# Patient Record
Sex: Male | Born: 1970 | Race: White | Hispanic: Yes | Marital: Single | Smoking: Never smoker
Health system: Southern US, Community
[De-identification: ages and names within clinical notes are randomized; demographics above are authoritative.]

## PROBLEM LIST (undated history)

## (undated) DIAGNOSIS — E785 Hyperlipidemia, unspecified: Secondary | ICD-10-CM

## (undated) DIAGNOSIS — I10 Essential (primary) hypertension: Secondary | ICD-10-CM

## (undated) DIAGNOSIS — I2510 Atherosclerotic heart disease of native coronary artery without angina pectoris: Secondary | ICD-10-CM

## (undated) DIAGNOSIS — I2119 ST elevation (STEMI) myocardial infarction involving other coronary artery of inferior wall: Secondary | ICD-10-CM

## (undated) DIAGNOSIS — Z9861 Coronary angioplasty status: Secondary | ICD-10-CM

---

## 2018-03-03 ENCOUNTER — Inpatient Hospital Stay (HOSPITAL_COMMUNITY)
Admission: EM | Admit: 2018-03-03 | Discharge: 2018-03-05 | DRG: 247 | Disposition: A | Discharge status: Home / Self Care | Payer: Self-pay | Attending: Cardiology | Admitting: Cardiology

## 2018-03-03 ENCOUNTER — Emergency Department (HOSPITAL_COMMUNITY): Payer: Self-pay

## 2018-03-03 ENCOUNTER — Encounter (HOSPITAL_COMMUNITY): Payer: Self-pay | Admitting: *Deleted

## 2018-03-03 ENCOUNTER — Inpatient Hospital Stay (HOSPITAL_COMMUNITY)
Admission: EM | Disposition: A | Discharge status: Home / Self Care | Payer: Self-pay | Source: Home / Self Care | Attending: Cardiology

## 2018-03-03 ENCOUNTER — Other Ambulatory Visit: Payer: Self-pay

## 2018-03-03 DIAGNOSIS — I213 ST elevation (STEMI) myocardial infarction of unspecified site: Secondary | ICD-10-CM

## 2018-03-03 DIAGNOSIS — E876 Hypokalemia: Secondary | ICD-10-CM | POA: Diagnosis present

## 2018-03-03 DIAGNOSIS — I10 Essential (primary) hypertension: Secondary | ICD-10-CM

## 2018-03-03 DIAGNOSIS — I2510 Atherosclerotic heart disease of native coronary artery without angina pectoris: Secondary | ICD-10-CM

## 2018-03-03 DIAGNOSIS — I2119 ST elevation (STEMI) myocardial infarction involving other coronary artery of inferior wall: Principal | ICD-10-CM | POA: Diagnosis present

## 2018-03-03 DIAGNOSIS — E785 Hyperlipidemia, unspecified: Secondary | ICD-10-CM

## 2018-03-03 DIAGNOSIS — I2111 ST elevation (STEMI) myocardial infarction involving right coronary artery: Secondary | ICD-10-CM

## 2018-03-03 DIAGNOSIS — Z9861 Coronary angioplasty status: Secondary | ICD-10-CM

## 2018-03-03 HISTORY — DX: Hyperlipidemia, unspecified: E78.5

## 2018-03-03 HISTORY — DX: ST elevation (STEMI) myocardial infarction involving other coronary artery of inferior wall: I21.19

## 2018-03-03 HISTORY — DX: Coronary angioplasty status: Z98.61

## 2018-03-03 HISTORY — PX: CORONARY/GRAFT ACUTE MI REVASCULARIZATION: CATH118305

## 2018-03-03 HISTORY — PX: LEFT HEART CATH AND CORONARY ANGIOGRAPHY: CATH118249

## 2018-03-03 HISTORY — DX: Essential (primary) hypertension: I10

## 2018-03-03 HISTORY — DX: Atherosclerotic heart disease of native coronary artery without angina pectoris: I25.10

## 2018-03-03 LAB — BASIC METABOLIC PANEL
ANION GAP: 12 (ref 5–15)
Anion gap: 13 (ref 5–15)
BUN: 15 mg/dL (ref 6–20)
BUN: 9 mg/dL (ref 6–20)
CALCIUM: 9 mg/dL (ref 8.9–10.3)
CALCIUM: 8.7 mg/dL — AB (ref 8.9–10.3)
CO2: 23 mmol/L (ref 22–32)
CO2: 18 mmol/L — AB (ref 22–32)
CREATININE: 0.83 mg/dL (ref 0.61–1.24)
Chloride: 106 mmol/L (ref 101–111)
Chloride: 108 mmol/L (ref 101–111)
Creatinine, Ser: 1.17 mg/dL (ref 0.61–1.24)
GFR calc Af Amer: >60 mL/min (ref >60)
GFR calc non Af Amer: >60 mL/min (ref >60)
GLUCOSE: 166 mg/dL — AB (ref 65–99)
Glucose, Bld: 143 mg/dL — ABNORMAL HIGH (ref 65–99)
Potassium: 3.1 mmol/L — ABNORMAL LOW (ref 3.5–5.1)
Potassium: 3.7 mmol/L (ref 3.5–5.1)
SODIUM: 139 mmol/L (ref 135–145)
Sodium: 141 mmol/L (ref 135–145)

## 2018-03-03 LAB — LIPID PANEL
CHOL/HDL RATIO: 4 ratio
CHOLESTEROL: 170 mg/dL (ref 0–200)
Cholesterol: 192 mg/dL (ref 0–200)
HDL: 38 mg/dL — ABNORMAL LOW (ref >40)
HDL: 42 mg/dL (ref >40)
LDL CALC: 109 mg/dL — AB (ref 0–99)
LDL CALC: 111 mg/dL — AB (ref 0–99)
TRIGLYCERIDES: 225 mg/dL — AB (ref <150)
Total CHOL/HDL Ratio: 5.1 RATIO
Triglycerides: 84 mg/dL (ref <150)
VLDL: 45 mg/dL — ABNORMAL HIGH (ref 0–40)
VLDL: 17 mg/dL (ref 0–40)

## 2018-03-03 LAB — TROPONIN I
Troponin I: 0.03 ng/mL (ref <0.03)
Troponin I: >65 ng/mL (ref <0.03)
Troponin I: >65 ng/mL (ref <0.03)

## 2018-03-03 LAB — MRSA PCR SCREENING: MRSA by PCR: NEGATIVE

## 2018-03-03 LAB — CBC
HCT: 42.8 % (ref 39.0–52.0)
HCT: 39.6 % (ref 39.0–52.0)
HEMOGLOBIN: 14.7 g/dL (ref 13.0–17.0)
Hemoglobin: 13.6 g/dL (ref 13.0–17.0)
MCH: 30.9 pg (ref 26.0–34.0)
MCH: 30.4 pg (ref 26.0–34.0)
MCHC: 34.3 g/dL (ref 30.0–36.0)
MCHC: 34.3 g/dL (ref 30.0–36.0)
MCV: 89.9 fL (ref 78.0–100.0)
MCV: 88.4 fL (ref 78.0–100.0)
Platelets: 233 10*3/uL (ref 150–400)
Platelets: 207 10*3/uL (ref 150–400)
RBC: 4.76 MIL/uL (ref 4.22–5.81)
RBC: 4.48 MIL/uL (ref 4.22–5.81)
RDW: 13 % (ref 11.5–15.5)
RDW: 13.2 % (ref 11.5–15.5)
WBC: 8.4 10*3/uL (ref 4.0–10.5)
WBC: 11.3 10*3/uL — ABNORMAL HIGH (ref 4.0–10.5)

## 2018-03-03 LAB — PROTIME-INR
INR: 1.02
Prothrombin Time: 13.3 seconds (ref 11.4–15.2)

## 2018-03-03 LAB — I-STAT TROPONIN, ED: TROPONIN I, POC: 0.04 ng/mL (ref 0.00–0.08)

## 2018-03-03 LAB — APTT: aPTT: 29 seconds (ref 24–36)

## 2018-03-03 SURGERY — CORONARY/GRAFT ACUTE MI REVASCULARIZATION
Anesthesia: LOCAL

## 2018-03-03 MED ORDER — SODIUM CHLORIDE 0.9 % IV SOLN: 250.0000 mL | INTRAVENOUS | Status: DC | PRN

## 2018-03-03 MED ORDER — VERAPAMIL HCL 2.5 MG/ML IV SOLN
INTRAVENOUS | Status: AC
  Filled 2018-03-03: qty 2

## 2018-03-03 MED ORDER — MIDAZOLAM HCL 2 MG/2ML IJ SOLN
INTRAMUSCULAR | Status: AC
  Filled 2018-03-03: qty 2

## 2018-03-03 MED ORDER — HEPARIN (PORCINE) IN NACL 2-0.9 UNIT/ML-% IJ SOLN
INTRAMUSCULAR | Status: DC | PRN
  Administered 2018-03-03: 10 mL via INTRA_ARTERIAL

## 2018-03-03 MED ORDER — SODIUM CHLORIDE 0.9 % IV SOLN
INTRAVENOUS | Status: DC
  Administered 2018-03-03: 02:00:00 via INTRAVENOUS

## 2018-03-03 MED ORDER — METOPROLOL TARTRATE 25 MG PO TABS
25.0000 mg | ORAL_TABLET | Freq: Two times a day (BID) | ORAL | Status: DC
  Administered 2018-03-03 (×2): 25 mg via ORAL
  Filled 2018-03-03 (×2): qty 1

## 2018-03-03 MED ORDER — ONDANSETRON HCL 4 MG/2ML IJ SOLN
4.0000 mg | Freq: Four times a day (QID) | INTRAMUSCULAR | Status: DC | PRN
  Administered 2018-03-03 (×2): 4 mg via INTRAVENOUS
  Filled 2018-03-03 (×2): qty 2

## 2018-03-03 MED ORDER — ACETAMINOPHEN 325 MG PO TABS
650.0000 mg | ORAL_TABLET | ORAL | Status: DC | PRN
  Administered 2018-03-03: 650 mg via ORAL
  Filled 2018-03-03: qty 2

## 2018-03-03 MED ORDER — NITROGLYCERIN 1 MG/10 ML FOR IR/CATH LAB
INTRA_ARTERIAL | Status: DC | PRN
  Administered 2018-03-03: 200 ug via INTRACORONARY

## 2018-03-03 MED ORDER — HEPARIN SODIUM (PORCINE) 5000 UNIT/ML IJ SOLN
INTRAMUSCULAR | Status: AC
  Filled 2018-03-03: qty 1

## 2018-03-03 MED ORDER — TICAGRELOR 90 MG PO TABS
ORAL_TABLET | ORAL | Status: DC | PRN
  Administered 2018-03-03: 180 mg via ORAL

## 2018-03-03 MED ORDER — SODIUM CHLORIDE 0.9 % IV SOLN: 4.0000 ug/kg/min | INTRAVENOUS | Status: DC

## 2018-03-03 MED ORDER — IOPAMIDOL (ISOVUE-370) INJECTION 76%
INTRAVENOUS | Status: DC | PRN
  Administered 2018-03-03: 125 mL via INTRA_ARTERIAL

## 2018-03-03 MED ORDER — ATROPINE SULFATE 1 MG/10ML IJ SOSY
PREFILLED_SYRINGE | INTRAMUSCULAR | Status: DC | PRN
  Administered 2018-03-03: 0.5 mg via INTRAVENOUS

## 2018-03-03 MED ORDER — IOHEXOL 350 MG/ML SOLN
INTRAVENOUS | Status: DC | PRN
  Administered 2018-03-03: 30 mL via INTRA_ARTERIAL

## 2018-03-03 MED ORDER — HEPARIN SODIUM (PORCINE) 1000 UNIT/ML IJ SOLN
INTRAMUSCULAR | Status: AC
  Filled 2018-03-03: qty 1

## 2018-03-03 MED ORDER — FENTANYL CITRATE (PF) 100 MCG/2ML IJ SOLN
INTRAMUSCULAR | Status: AC
  Filled 2018-03-03: qty 2

## 2018-03-03 MED ORDER — ATROPINE SULFATE 1 MG/10ML IJ SOSY
PREFILLED_SYRINGE | INTRAMUSCULAR | Status: AC
  Filled 2018-03-03: qty 10

## 2018-03-03 MED ORDER — LIDOCAINE HCL (PF) 1 % IJ SOLN
INTRAMUSCULAR | Status: AC
  Filled 2018-03-03: qty 30

## 2018-03-03 MED ORDER — NITROGLYCERIN IN D5W 200-5 MCG/ML-% IV SOLN
0.0000 ug/min | INTRAVENOUS | Status: DC
  Administered 2018-03-03: 5 ug/min via INTRAVENOUS
  Filled 2018-03-03: qty 250

## 2018-03-03 MED ORDER — ASPIRIN 81 MG PO CHEW
81.0000 mg | CHEWABLE_TABLET | Freq: Every day | ORAL | Status: DC
  Administered 2018-03-03 – 2018-03-05 (×3): 81 mg via ORAL
  Filled 2018-03-03 (×3): qty 1

## 2018-03-03 MED ORDER — MIDAZOLAM HCL 2 MG/2ML IJ SOLN
INTRAMUSCULAR | Status: DC | PRN
  Administered 2018-03-03: 2 mg via INTRAVENOUS

## 2018-03-03 MED ORDER — ISOSORBIDE MONONITRATE ER 30 MG PO TB24
30.0000 mg | ORAL_TABLET | Freq: Every day | ORAL | Status: DC
  Administered 2018-03-03: 30 mg via ORAL
  Filled 2018-03-03: qty 1

## 2018-03-03 MED ORDER — HEPARIN SODIUM (PORCINE) 1000 UNIT/ML IJ SOLN
INTRAMUSCULAR | Status: DC | PRN
  Administered 2018-03-03: 4000 [IU] via INTRAVENOUS
  Administered 2018-03-03: 3000 [IU] via INTRAVENOUS

## 2018-03-03 MED ORDER — LISINOPRIL 5 MG PO TABS
5.0000 mg | ORAL_TABLET | Freq: Every day | ORAL | Status: DC
  Administered 2018-03-03 – 2018-03-05 (×3): 5 mg via ORAL
  Filled 2018-03-03 (×3): qty 1

## 2018-03-03 MED ORDER — HEPARIN (PORCINE) IN NACL 2-0.9 UNIT/ML-% IJ SOLN
INTRAMUSCULAR | Status: AC | PRN
  Administered 2018-03-03 (×2): 500 mL

## 2018-03-03 MED ORDER — TICAGRELOR 90 MG PO TABS
ORAL_TABLET | ORAL | Status: AC
  Filled 2018-03-03: qty 2

## 2018-03-03 MED ORDER — CANGRELOR BOLUS VIA INFUSION
INTRAVENOUS | Status: DC | PRN
  Administered 2018-03-03: 1986 ug via INTRAVENOUS

## 2018-03-03 MED ORDER — SODIUM CHLORIDE 0.9 % IV SOLN
INTRAVENOUS | Status: AC | PRN
  Administered 2018-03-03: 4 ug/kg/min via INTRAVENOUS

## 2018-03-03 MED ORDER — ASPIRIN 81 MG PO CHEW
324.0000 mg | CHEWABLE_TABLET | Freq: Once | ORAL | Status: AC
  Administered 2018-03-03: 324 mg via ORAL

## 2018-03-03 MED ORDER — SODIUM CHLORIDE 0.9 % IV SOLN
INTRAVENOUS | Status: AC
  Administered 2018-03-03: 04:00:00 via INTRAVENOUS

## 2018-03-03 MED ORDER — ATORVASTATIN CALCIUM 80 MG PO TABS
80.0000 mg | ORAL_TABLET | Freq: Every day | ORAL | Status: DC
  Administered 2018-03-03 – 2018-03-04 (×2): 80 mg via ORAL
  Filled 2018-03-03 (×2): qty 1

## 2018-03-03 MED ORDER — SODIUM CHLORIDE 0.9% FLUSH: 3.0000 mL | INTRAVENOUS | Status: DC | PRN

## 2018-03-03 MED ORDER — HEPARIN SODIUM (PORCINE) 5000 UNIT/ML IJ SOLN
4000.0000 [IU] | Freq: Once | INTRAMUSCULAR | Status: AC
  Administered 2018-03-03: 4000 [IU] via INTRAVENOUS
  Administered 2018-03-03: 02:00:00 via INTRAVENOUS

## 2018-03-03 MED ORDER — LABETALOL HCL 5 MG/ML IV SOLN: 10.0000 mg | INTRAVENOUS | Status: AC | PRN

## 2018-03-03 MED ORDER — NITROGLYCERIN 0.4 MG SL SUBL
SUBLINGUAL_TABLET | SUBLINGUAL | Status: AC
  Filled 2018-03-03: qty 1

## 2018-03-03 MED ORDER — SODIUM CHLORIDE 0.9% FLUSH
3.0000 mL | Freq: Two times a day (BID) | INTRAVENOUS | Status: DC
  Administered 2018-03-03 – 2018-03-05 (×5): 3 mL via INTRAVENOUS

## 2018-03-03 MED ORDER — IOPAMIDOL (ISOVUE-370) INJECTION 76%
INTRAVENOUS | Status: AC
  Filled 2018-03-03: qty 125

## 2018-03-03 MED ORDER — FENTANYL CITRATE (PF) 100 MCG/2ML IJ SOLN
INTRAMUSCULAR | Status: DC | PRN
  Administered 2018-03-03: 50 ug via INTRAVENOUS
  Administered 2018-03-03 (×2): 25 ug via INTRAVENOUS

## 2018-03-03 MED ORDER — HEPARIN (PORCINE) IN NACL 2-0.9 UNIT/ML-% IJ SOLN
INTRAMUSCULAR | Status: AC
  Filled 2018-03-03: qty 1000

## 2018-03-03 MED ORDER — CANGRELOR TETRASODIUM 50 MG IV SOLR
INTRAVENOUS | Status: AC
  Filled 2018-03-03: qty 50

## 2018-03-03 MED ORDER — TICAGRELOR 90 MG PO TABS
90.0000 mg | ORAL_TABLET | Freq: Two times a day (BID) | ORAL | Status: DC
  Administered 2018-03-03 – 2018-03-05 (×4): 90 mg via ORAL
  Filled 2018-03-03 (×5): qty 1

## 2018-03-03 MED ORDER — ASPIRIN 81 MG PO CHEW
CHEWABLE_TABLET | ORAL | Status: AC
  Filled 2018-03-03: qty 2

## 2018-03-03 MED ORDER — MORPHINE SULFATE (PF) 4 MG/ML IV SOLN
2.0000 mg | INTRAVENOUS | Status: DC | PRN
  Administered 2018-03-03: 2 mg via INTRAVENOUS
  Filled 2018-03-03: qty 1

## 2018-03-03 MED ORDER — LIDOCAINE HCL (PF) 1 % IJ SOLN
INTRAMUSCULAR | Status: DC | PRN
  Administered 2018-03-03: 4 mL via INTRADERMAL

## 2018-03-03 MED ORDER — NITROGLYCERIN 1 MG/10 ML FOR IR/CATH LAB
INTRA_ARTERIAL | Status: AC
  Filled 2018-03-03: qty 10

## 2018-03-03 MED ORDER — HYDRALAZINE HCL 20 MG/ML IJ SOLN: 5.0000 mg | INTRAMUSCULAR | Status: AC | PRN

## 2018-03-03 SURGICAL SUPPLY — 19 items
BALLN EMERGE MR 2.5X12 (BALLOONS) ×2
BALLN ~~LOC~~ EMERGE MR 4.0X12 (BALLOONS) ×2
BALLOON EMERGE MR 2.5X12 (BALLOONS) ×1 IMPLANT
BALLOON ~~LOC~~ EMERGE MR 4.0X12 (BALLOONS) ×1 IMPLANT
BAND ZEPHYR COMPRESS 30 LONG (HEMOSTASIS) ×2 IMPLANT
CATH IMPULSE 5F ANG/FL3.5 (CATHETERS) ×2 IMPLANT
CATH LAUNCHER 6FR JR4 (CATHETERS) ×2 IMPLANT
GUIDEWIRE INQWIRE 1.5J.035X260 (WIRE) ×1 IMPLANT
INQWIRE 1.5J .035X260CM (WIRE) ×2
KIT ENCORE 26 ADVANTAGE (KITS) ×2 IMPLANT
KIT HEART LEFT (KITS) ×2 IMPLANT
KIT HEMO VALVE WATCHDOG (MISCELLANEOUS) ×2 IMPLANT
NEEDLE PERC 21GX4CM (NEEDLE) ×2 IMPLANT
PACK CARDIAC CATHETERIZATION (CUSTOM PROCEDURE TRAY) ×2 IMPLANT
SHEATH RAIN RADIAL 21G 6FR (SHEATH) ×2 IMPLANT
STENT SIERRA 4.00 X 18 MM (Permanent Stent) ×2 IMPLANT
TRANSDUCER W/STOPCOCK (MISCELLANEOUS) ×2 IMPLANT
TUBING CIL FLEX 10 FLL-RA (TUBING) ×2 IMPLANT
WIRE HI TORQ BMW 190CM (WIRE) ×2 IMPLANT

## 2018-03-03 NOTE — Progress Notes (Signed)
Doing "much better" s/p PCI of RCA. Reports very minimal residual discomfort.  BP is elevated.  Will add lisinopril and follow closely  Hillis RangeJames Keajah Killough MD, [facility] 03/03/2018 8:27 AM

## 2018-03-03 NOTE — Progress Notes (Addendum)
Patient admitted to 2H14 from cath lab. R radial site with TR band & 15cc air in place, small hematoma noted proximal to the TR band. RN marked with marker. Corey Allen rounding and assessed as well, site is soft. Coban placed to control hematoma as well. MD also reviewed EKG which still shows ST elevation in leads II, III, aVF, V4-6. Pt denies chest pain or any other discomfort at this time.   Pt also states that Spanish is his primary language, does speak [place] fluently as well. He declines offered interpreter at this time, states that he doesn't feel it is needed. Pt made aware that interpreter services are available if he feels they are needed, pt verbalizes understanding.   Pt brother, Corey Allen, at bedside visiting and updated on plan of care. Password obtained.  Per Corey Allen, give scheduled PO metoprolol early for hypertension.

## 2018-03-03 NOTE — ED Provider Notes (Signed)
[facility]   CSN: [id number] Arrival date & time: 03/03/18  0152     History   Chief Complaint Chief Complaint  Patient presents with  . Chest Pain    HPI Corey Allen is a 47 y.o. male.  Patient presents with central chest pain that woke him from sleep about midnight.  Pain is constant radiates to his neck and back.  He is never had this kind of pain before.  Associated with nausea but no vomiting.  No shortness of breath or diaphoresis.  Patient denies cardiac history.  Denies any chronic medical problems or medication use.  He is hypertensive on arrival.  EKG shows inferior lateral STEMI.  The history is provided by the patient and a relative. The history is limited by the condition of the patient.  Chest Pain      No past medical history on file.  There are no active problems to display for this patient.   ** The histories are not reviewed yet. Please review them in the "History" navigator section and refresh this SmartLink.      Home Medications    Prior to Admission medications   Not on File    Family History No family history on file.  Social History Social History   Tobacco Use  . Smoking status: Not on file  Substance Use Topics  . Alcohol use: Not on file  . Drug use: Not on file     Allergies   Patient has no allergy information on record.   Review of Systems Review of Systems  Unable to perform ROS: Acuity of condition  Cardiovascular: Positive for chest pain.     Physical Exam Updated Vital Signs BP (!) 155/102 (BP Location: Right Arm)   Pulse 66   SpO2 100%   Physical Exam  Constitutional: He is oriented to person, place, and time. He appears well-developed and well-nourished. No distress.  Marland Kitchen.  Appears uncomfortable  HENT:  Head: Normocephalic and atraumatic.  Mouth/Throat: Oropharynx is clear and moist. No oropharyngeal exudate.  Eyes: Pupils are equal, round, and  reactive to light. Conjunctivae and EOM are normal.  Neck: Normal range of motion. Neck supple.  No meningismus.  Cardiovascular: Normal rate, regular rhythm, normal heart sounds and intact distal pulses.  No murmur heard. Equal radial pulses and grip strengths  Equal femoral pulses equal DP and PT pulses   Pulmonary/Chest: Effort normal and breath sounds normal. No respiratory distress. He exhibits no tenderness.  Abdominal: Soft. There is no tenderness. There is no rebound and no guarding.  Musculoskeletal: Normal range of motion. He exhibits no edema or tenderness.  Neurological: He is alert and oriented to person, place, and time. No cranial nerve deficit. He exhibits normal muscle tone. Coordination normal.  No ataxia on finger to nose bilaterally. No pronator drift. 5/5 strength throughout. CN 2-12 intact.Equal grip strength. Sensation intact.   Skin: Skin is warm.  Psychiatric: He has a normal mood and affect. His behavior is normal.  Nursing note and vitals reviewed.    ED Treatments / Results  Labs (all labs ordered are listed, but only abnormal results are displayed) Labs Reviewed  BASIC METABOLIC PANEL - Abnormal; Notable for the following components:      Result Value   Potassium 3.1 (*)    Glucose, Bld 166 (*)    All other components within normal limits  TROPONIN I - Abnormal; Notable for the following components:   Troponin  I 0.03 (*)    All other components within normal limits  LIPID PANEL - Abnormal; Notable for the following components:   Triglycerides 225 (*)    HDL 38 (*)    VLDL 45 (*)    LDL Cholesterol 109 (*)    All other components within normal limits  MRSA PCR SCREENING  CBC  PROTIME-INR  APTT  LIPID PANEL  BASIC METABOLIC PANEL  CBC  TROPONIN I  TROPONIN I  TROPONIN I  TROPONIN I  I-STAT TROPONIN, ED    EKG EKG Interpretation  Date/Time:  Sunday March 03 2018 01:59:09 EDT Ventricular Rate:  59 PR Interval:  242 QRS  Duration: 102 QT Interval:  398 QTC Calculation: 394 R Axis:   87 Text Interpretation:  ** Critical Test Result: STEMI Sinus bradycardia with 1st degree A-V block Inferior-posterior infarct , possibly acute Lateral injury pattern ** ** ACUTE MI / STEMI ** ** Consider right ventricular involvement in acute inferior infarct Abnormal ECG No previous ECGs available Confirmed by Glynn Octave [phone]) on 03/03/2018 2:11:36 AM   Radiology Dg Chest Port 1 View  Result Date: 03/03/2018 CLINICAL DATA:  Chest pain starting at midnight. EXAM: PORTABLE CHEST 1 VIEW COMPARISON:  None. FINDINGS: The heart size and mediastinal contours are within normal limits. Mild uncoiling of the thoracic aorta without aneurysm. No pulmonary consolidation or CHF. No effusion or pneumothorax. The visualized skeletal structures are unremarkable. IMPRESSION: No active disease. Electronically Signed   By: Tollie Eth M.D.   On: 03/03/2018 02:34    Procedures Procedures (including critical care time)  Medications Ordered in ED Medications  0.9 %  sodium chloride infusion (has no administration in time range)  aspirin 81 MG chewable tablet (has no administration in time range)  heparin 5000 UNIT/ML injection (has no administration in time range)  aspirin chewable tablet 324 mg (324 mg Oral Given 03/03/18 0214)  heparin injection 4,000 Units (4,000 Units Intravenous Given 03/03/18 0214)     Initial Impression / Assessment and Plan / ED Course  I have reviewed the triage vital signs and the nursing notes.  Pertinent labs & imaging results that were available during my care of the patient were reviewed by me and considered in my medical decision making (see chart for details).     Patient presents from home with chest pain that woke him from sleep with chest pain that radiates to neck and back.  Associated with nausea.  EKG shows inferior lateral STEMI.  Patient given aspirin and heparin.  Labs obtained.  Cardiology  fellow at bedside.  Case discussed with Dr. Herbie Baltimore who is taking patient emergently to the Cath Lab.  Blood pressure mental status stable at time of transfer.  CRITICAL CARE Performed by: Glynn Octave Total critical care time: 30 minutes Critical care time was exclusive of separately billable procedures and treating other patients. Critical care was necessary to treat or prevent imminent or life-threatening deterioration. Critical care was time spent personally by me on the following activities: development of treatment plan with patient and/or surrogate as well as nursing, discussions with consultants, evaluation of patient's response to treatment, examination of patient, obtaining history from patient or surrogate, ordering and performing treatments and interventions, ordering and review of laboratory studies, ordering and review of radiographic studies, pulse oximetry and re-evaluation of patient's condition.   Final Clinical Impressions(s) / ED Diagnoses   Final diagnoses:  ST elevation myocardial infarction (STEMI), unspecified artery [facility])    ED Discharge Orders  None       Glynn Octave, MD 03/03/18 [phone]

## 2018-03-03 NOTE — Progress Notes (Signed)
Continue to have intermittent chest pain, EKG reviewed, still shows ST elevation in inferior leads, improved when compare to last EKG. On initial exam, chest pain is 0. Now has mild chest pain, radiating from back. Will continue to observe.   Ramond DialSigned, Tarvares Lant PA Pager: [phone]

## 2018-03-03 NOTE — Care Management Note (Signed)
Case Management Note  Patient Details  Name: Corey Allen MRN: [id number] Date of Birth: 10-08-1971  Subjective/Objective:  Pt presented for STEMI and is s/p cath and stent placement.  Started on Brilinta.  Pt verifies that he is uninsured and undocumented and cannot afford expensive medication.  The patient assistance program for [employer] requires a green card or work visa, which patient does not have.  If cardiology office is unable to provide samples on an ongoing basis, MD may want to consider changing medication.                 Action/Plan: Patient given 30 day free Brilinta card and instructed to take to pharmacy to have first month filled free.   Expected Discharge Date:                  Expected Discharge Plan:  Home/Self Care  In-House Referral:     Discharge planning Services  CM Consult, Medication Assistance  Post Acute Care Choice:    Choice offered to:     DME Arranged:    DME Agency:     HH Arranged:    HH Agency:     Status of Service:  In process, will continue to follow  If discussed at Long Length of Stay Meetings, dates discussed:    Additional Comments:  Deveron Furlongshley  Kenith Trickel, RN 03/03/2018, 11:30 AM

## 2018-03-03 NOTE — ED Triage Notes (Signed)
:  Pt states chest pain began about midnight. Center of chest, hard to describe.  No SHOB.  Nausea.  Vomiting.

## 2018-03-03 NOTE — Progress Notes (Signed)
Pt given stent card and educated. Instructed to keep in wallet at all times.

## 2018-03-03 NOTE — ED Notes (Signed)
Activated Stemi to [phone]

## 2018-03-03 NOTE — Plan of Care (Signed)
  Problem: Education: Goal: Knowledge of General Education information will improve Outcome: Progressing   Problem: Health Behavior/Discharge Planning: Goal: Ability to manage health-related needs will improve Outcome: Progressing   Problem: Clinical Measurements: Goal: Ability to maintain clinical measurements within normal limits will improve Outcome: Progressing Goal: Will remain free from infection Outcome: Progressing Goal: Diagnostic test results will improve Outcome: Progressing Goal: Respiratory complications will improve Outcome: Progressing Goal: Cardiovascular complication will be avoided Outcome: Progressing   Problem: Safety: Goal: Ability to remain free from injury will improve Outcome: Progressing   Problem: Education: Goal: Understanding of cardiac disease, CV risk reduction, and recovery process will improve Outcome: Progressing Goal: Understanding of medication regimen will improve Outcome: Progressing   Problem: Activity: Goal: Ability to tolerate increased activity will improve Outcome: Progressing   Problem: Cardiac: Goal: Ability to achieve and maintain adequate cardiopulmonary perfusion will improve Outcome: Progressing Goal: Vascular access site(s) Level 0-1 will be maintained Outcome: Progressing Note:  Small hematoma to right radial site, coban placed proximal to TR band.    Problem: Health Behavior/Discharge Planning: Goal: Ability to safely manage health-related needs after discharge will improve Outcome: Progressing

## 2018-03-03 NOTE — H&P (Signed)
History and Physical  Primary Cardiologist:N/A PCP: No primary care provider on file.  Chief Complaint: N/A  HPI:  47 year old male with no past medical history presenting with chest pain found to have inferolateral ST elevations.  He lives here locally, noticed chest pain this evening while trying to sleep which didn't improve, prompting presentation.  This was associated with N/V x 1.  Pain is substernal, radiating to posterior neck and jaw.  ECG shows inferolateral STEMI with ~5-6 mm of elevation and involvement of lateral precordial leads.  Given ASA, 4,000 heparin in EMS.    He denies family history of CAD, no personal past medical history, no smoking, alcohol, cocaine use history.   Prior Cardiac Studies:  No past medical history on file.  No family history on file. Social History:  has no tobacco, alcohol, and drug history on file.  Allergies: Not on File  No current facility-administered medications on file prior to encounter.    No current outpatient medications on file prior to encounter.   Results for orders placed or performed during the hospital encounter of 03/03/18 (from the past 48 hour(s))  Basic metabolic panel     Status: Abnormal   Collection Time: 03/03/18  2:00 AM  Result Value Ref Range   Sodium 141 135 - 145 mmol/L   Potassium 3.1 (L) 3.5 - 5.1 mmol/L   Chloride 106 101 - 111 mmol/L   CO2 23 22 - 32 mmol/L   Glucose, Bld 166 (H) 65 - 99 mg/dL   BUN 15 6 - 20 mg/dL   Creatinine, Ser 1.17 0.61 - 1.24 mg/dL   Calcium 9.0 8.9 - 10.3 mg/dL   GFR calc non Af Amer >60 >60 mL/min   GFR calc Af Amer >60 >60 mL/min    Comment: (NOTE) The eGFR has been calculated using the CKD EPI equation. This calculation has not been validated in all clinical situations. eGFR's persistently <60 mL/min signify possible Chronic Kidney Disease.    Anion gap 12 5 - 15    Comment: Performed at [facility] [address], [place] [address]  CBC      Status: None   Collection Time: 03/03/18  2:00 AM  Result Value Ref Range   WBC 8.4 4.0 - 10.5 K/uL   RBC 4.76 4.22 - 5.81 MIL/uL   Hemoglobin 14.7 13.0 - 17.0 g/dL   HCT 42.8 39.0 - 52.0 %   MCV 89.9 78.0 - 100.0 fL   MCH 30.9 26.0 - 34.0 pg   MCHC 34.3 30.0 - 36.0 g/dL   RDW 13.0 11.5 - 15.5 %   Platelets 233 150 - 400 K/uL    Comment: Performed at [facility] [address], [place], [place] [address]  I-stat troponin, ED     Status: None   Collection Time: 03/03/18  2:03 AM  Result Value Ref Range   Troponin i, poc 0.04 0.00 - 0.08 ng/mL   Comment 3            Comment: Due to the release kinetics of cTnI, a negative result within the first hours of the onset of symptoms does not rule out myocardial infarction with certainty. If myocardial infarction is still suspected, repeat the test at appropriate intervals.    Dg Chest Port 1 View  Result Date: 03/03/2018 CLINICAL DATA:  Chest pain starting at midnight. EXAM: PORTABLE CHEST 1 VIEW COMPARISON:  None. FINDINGS: The heart size and mediastinal contours  are within normal limits. Mild uncoiling of the thoracic aorta without aneurysm. No pulmonary consolidation or CHF. No effusion or pneumothorax. The visualized skeletal structures are unremarkable. IMPRESSION: No active disease. Electronically Signed   By: Ashley Royalty M.D.   On: 03/03/2018 02:34    ECG/Tele: NSR with inferolateral lateral ST elevations  ROS: As above. Otherwise, review of systems is negative unless per above HPI  Vitals:   03/03/18 0159 03/03/18 0222 03/03/18 0246  BP: (!) 155/102    Pulse: 66    SpO2: 100%  99%  Weight:  66.2 kg (146 lb)   Height:  _0  (1.702 m)    Wt Readings from Last 10 Encounters:  03/03/18 66.2 kg (146 lb)    PE:  General: appears acutely ill.  HEENT: Atraumatic, EOMI, mucous membranes moist. No JVD at 45 degrees. No HJR. CV: RRR no murmurs, gallops.  Respiratory: Clear, no crackles. Normal work of breathing ABD:  Non-distended and non-tender. No palpable organomegaly.  Extremities: 2+ radial pulses bilaterally. no edema. 2+ PT/AT pulses bilaterally.   Neuro/Psych: CN grossly intact, alert and oriented  Assessment/Plan Inferolateral STEMI  STEMI - S/P 325 ASA, IV UF Heparin - Emergent cath lab activation with Dr. Ellyn Hack.   - Post: TTE, CCU admit, statin, beta blocker  Hypokalemia - Replete post procedure  Corey Cram Means  MD 03/03/2018, 2:48 AM

## 2018-03-04 ENCOUNTER — Encounter (HOSPITAL_COMMUNITY): Payer: Self-pay | Admitting: Cardiology

## 2018-03-04 ENCOUNTER — Inpatient Hospital Stay (HOSPITAL_COMMUNITY): Payer: Self-pay

## 2018-03-04 DIAGNOSIS — I2111 ST elevation (STEMI) myocardial infarction involving right coronary artery: Secondary | ICD-10-CM

## 2018-03-04 DIAGNOSIS — I2119 ST elevation (STEMI) myocardial infarction involving other coronary artery of inferior wall: Principal | ICD-10-CM

## 2018-03-04 HISTORY — PX: TRANSTHORACIC ECHOCARDIOGRAM: SHX275

## 2018-03-04 LAB — ECHOCARDIOGRAM COMPLETE
HEIGHTINCHES: 67 in
Weight: 2225.76 oz

## 2018-03-04 LAB — POCT ACTIVATED CLOTTING TIME
ACTIVATED CLOTTING TIME: 224 s
ACTIVATED CLOTTING TIME: 351 s

## 2018-03-04 LAB — TROPONIN I: Troponin I: >65 ng/mL (ref <0.03)

## 2018-03-04 MED ORDER — METOPROLOL TARTRATE 50 MG PO TABS
50.0000 mg | ORAL_TABLET | Freq: Two times a day (BID) | ORAL | Status: DC
  Administered 2018-03-04 – 2018-03-05 (×3): 50 mg via ORAL
  Filled 2018-03-04 (×3): qty 1

## 2018-03-04 NOTE — Progress Notes (Signed)
Progress Note  Patient Name: Corey Allen Date of Encounter: 03/04/2018  Primary Cardiologist: Herbie Baltimore  Subjective   No further chest pain. Postop day 2 in fear STEMI treated with PCI and drug-eluting stenting of the dominant RCA by Dr. Herbie Baltimore He remains on IV nitroglycerin which will be weaned off. We will transfer him to telemetry in anticipation of discharge the next 48 hours.  Inpatient Medications    Scheduled Meds: . aspirin  81 mg Oral Daily  . atorvastatin  80 mg Oral q1800  . lisinopril  5 mg Oral Daily  . metoprolol tartrate  25 mg Oral BID  . sodium chloride flush  3 mL Intravenous Q12H  . ticagrelor  90 mg Oral BID   Continuous Infusions: . sodium chloride Stopped (03/03/18 0420)  . sodium chloride    . nitroGLYCERIN Stopped (03/03/18 2126)   PRN Meds: sodium chloride, acetaminophen, morphine injection, ondansetron (ZOFRAN) IV, sodium chloride flush   Vital Signs    Vitals:   03/04/18 0500 03/04/18 0600 03/04/18 0700 03/04/18 0727  BP: 104/84 124/86 124/83   Pulse: 61 74 64   Resp: 18 19 15    Temp:    98.9 F (37.2 C)  TempSrc:    Oral  SpO2: 98% 98% 96%   Weight:      Height:        Intake/Output Summary (Last 24 hours) at 03/04/2018 0841 Last data filed at 03/03/2018 2300 Gross per 24 hour  Intake 463.85 ml  Output -  Net 463.85 ml   Filed Weights   03/03/18 0222 03/03/18 0400  Weight: 146 lb (66.2 kg) 139 lb 1.8 oz (63.1 kg)    Telemetry    Normal sinus rhythm - Personally Reviewed  ECG    Not performed today - Personally Reviewed  Physical Exam   GEN: No acute distress.   Neck: No JVD Cardiac: RRR, no murmurs, rubs, or gallops.  Respiratory: Clear to auscultation bilaterally. GI: Soft, nontender, non-distended  MS: No edema; No deformity. Neuro:  Nonfocal  Psych: Normal affect  extremities-right wrist appears stable  Labs    Chemistry Recent Labs  Lab 03/03/18 0200 03/03/18 0924  NA 141 139  K 3.1* 3.7  CL 106 108   CO2 23 18*  GLUCOSE 166* 143*  BUN 15 9  CREATININE 1.17 0.83  CALCIUM 9.0 8.7*  GFRNONAA >60 >60  GFRAA >60 >60  ANIONGAP 12 13     Hematology Recent Labs  Lab 03/03/18 0200 03/03/18 0924  WBC 8.4 11.3*  RBC 4.76 4.48  HGB 14.7 13.6  HCT 42.8 39.6  MCV 89.9 88.4  MCH 30.9 30.4  MCHC 34.3 34.3  RDW 13.0 13.2  PLT 233 207    Cardiac Enzymes Recent Labs  Lab 03/03/18 0924 03/03/18 1529 03/03/18 2154 03/04/18 0352  TROPONINI >65.00* >65.00* >65.00* >65.00*    Recent Labs  Lab 03/03/18 0203  TROPIPOC 0.04     BNPNo results for input(s): BNP, PROBNP in the last 168 hours.   DDimer No results for input(s): DDIMER in the last 168 hours.   Radiology    Dg Chest Port 1 View  Result Date: 03/03/2018 CLINICAL DATA:  Chest pain starting at midnight. EXAM: PORTABLE CHEST 1 VIEW COMPARISON:  None. FINDINGS: The heart size and mediastinal contours are within normal limits. Mild uncoiling of the thoracic aorta without aneurysm. No pulmonary consolidation or CHF. No effusion or pneumothorax. The visualized skeletal structures are unremarkable. IMPRESSION: No active disease. Electronically Signed  By: Tollie Ethavid  Kwon M.D.   On: 03/03/2018 02:34    Cardiac Studies     Patient Profile     47 y.o. male single without children who presented with chest pain early Sunday morning. He was on no medications at home. He does not smoke. His EKG showed inferior STEMI. He was taken to the Cath Lab by Dr. Herbie BaltimoreHarding performed radial intervention at 4:30 6 in the morning with a 4 mm drug-eluting stent in his RCA. He had minimal CAD otherwise. His LV function was moderately depressed with an inferior wall motion abnormality. He currently is pain free, hemodynamically stable  Assessment & Plan    1: Coronary artery disease-postop day one inferior STEMI with PCI and drug-eluting stenting using a 4 mmby 18 mm long Sierra DES. The patient is on dual antiplatelet therapy including aspirin  Brilenta. He denies chest pain. His IV nitroglycerin glycerin has been weaned off. A 2-D echo is pending although his ejection fraction by ventriculography was 45-50% with inferior wall motion abnormality. His peak troponin was greater than 65. EKG has improved. Given the patient's current social status as well as the input from case management. I'm concerned that he will not have access to the proper medications including antiplatelet therapy. He probably will need to be transitioned to Plavix.  2: Hyperlipidemia-total cholesterol 170 on high-dose statin therapy  Plan transfer to telemetry today. Cardiac rehabilitation, case management and further discuss access to B medications. Anticipate discharge on Wednesday.  For questions or updates, please contact [facility] Please consult [email or web address] for contact info under Cardiology/STEMI.      Signed, Nanetta BattyJonathan Jahnay Lantier, MD  03/04/2018, 8:41 AM

## 2018-03-04 NOTE — Plan of Care (Signed)
Was on nitroglycerin drip at the beginning of the night, was turned off around 2100 chest pain resolved as well as blood pressure meeting parameters.  Aware of safety and calls for assistance. Did tolerate ambulating to bathroom early this am.

## 2018-03-04 NOTE — Progress Notes (Signed)
  Echocardiogram 2D Echocardiogram has been performed.  Corey SavoyCasey N Rahsaan Allen 03/04/2018, 9:09 AM

## 2018-03-04 NOTE — Progress Notes (Signed)
CARDIAC REHAB PHASE I   PRE:  Rate/Rhythm: 65 SR     BP: sitting 118/82    SaO2: 99 RA  MODE:  Ambulation: 660 ft   POST:  Rate/Rhythm: 80 SR    BP: sitting 124/88     SaO2: 97 RA  Tolerated well, denied CP or other c/o. Encouraged more walking later. Gave pt MI information in Spanish to begin reading. Also gave stent card. He prefers to have a Spanish interpreter so I will set that up to go over education.  [phone]   Corey MassonRandi Kristan [name] CES, ACSM 03/04/2018 10:45 AM

## 2018-03-05 ENCOUNTER — Telehealth: Payer: Self-pay | Admitting: Physician Assistant

## 2018-03-05 ENCOUNTER — Encounter (HOSPITAL_COMMUNITY): Payer: Self-pay | Admitting: Cardiology

## 2018-03-05 DIAGNOSIS — E7800 Pure hypercholesterolemia, unspecified: Secondary | ICD-10-CM

## 2018-03-05 DIAGNOSIS — E785 Hyperlipidemia, unspecified: Secondary | ICD-10-CM

## 2018-03-05 DIAGNOSIS — Z9861 Coronary angioplasty status: Secondary | ICD-10-CM

## 2018-03-05 DIAGNOSIS — I10 Essential (primary) hypertension: Secondary | ICD-10-CM

## 2018-03-05 MED ORDER — NITROGLYCERIN 0.4 MG SL SUBL: 0.4000 mg | SUBLINGUAL_TABLET | SUBLINGUAL | 0 refills | Status: AC | PRN

## 2018-03-05 MED ORDER — ASPIRIN 81 MG PO CHEW: 81.0000 mg | CHEWABLE_TABLET | Freq: Every day | ORAL | Status: DC

## 2018-03-05 MED ORDER — ATORVASTATIN CALCIUM 80 MG PO TABS: 80.0000 mg | ORAL_TABLET | Freq: Every day | ORAL | 0 refills | Status: DC

## 2018-03-05 MED ORDER — LISINOPRIL 5 MG PO TABS: 5.0000 mg | ORAL_TABLET | Freq: Every day | ORAL | 0 refills | Status: DC

## 2018-03-05 MED ORDER — METOPROLOL TARTRATE 50 MG PO TABS: 50.0000 mg | ORAL_TABLET | Freq: Two times a day (BID) | ORAL | 0 refills | Status: DC

## 2018-03-05 MED ORDER — TICAGRELOR 90 MG PO TABS: 90.0000 mg | ORAL_TABLET | Freq: Two times a day (BID) | ORAL | 0 refills | Status: DC

## 2018-03-05 MED FILL — BRILINTA 90 MG TABLET: 90 | 30 days supply | Qty: 60 | Fill #0

## 2018-03-05 MED FILL — LISINOPRIL 5 MG TAB: 5 | 30 days supply | Qty: 30 | Fill #0

## 2018-03-05 MED FILL — ATORVASTATIN 80 MG TABLET: 80 | 30 days supply | Qty: 30 | Fill #0

## 2018-03-05 MED FILL — NITROSTAT 0.4 MG TABLET SL: 0.4 | 25 days supply | Qty: 25 | Fill #0

## 2018-03-05 MED FILL — METOPROLOL TARTRATE 50 MG T: 50 | 30 days supply | Qty: 60 | Fill #0

## 2018-03-05 MED FILL — Heparin Sodium (Porcine) 2 Unit/ML in Sodium Chloride 0.9%: INTRAMUSCULAR | Qty: 1000 | Status: AC

## 2018-03-05 NOTE — Telephone Encounter (Signed)
Patient discharged today 03/05/18 Patient has PA OV on 03/14/18 @ 10am with Azalee CourseHao Meng, PA

## 2018-03-05 NOTE — Telephone Encounter (Signed)
-----   Message from Leotis PainMarilyn K Fogleman sent at 03/05/2018 12:02 PM EDT ----- Regarding: FW: TOC appt FYI ----- Message ----- From: Arty Baumgartneroberts, Lindsay B, NP Sent: 03/05/2018  11:52 AM To: [facility] Subject: TOC appt                                       Pt needs a TOC follow up call.   Thx

## 2018-03-05 NOTE — Discharge Summary (Addendum)
Discharge Summary    Patient ID: Corey Allen,  MRN: [id number], DOB/AGE: October 07, 1971 47 y.o.  Admit date: 03/03/2018 Discharge date: 03/05/2018  Primary Care Provider: No primary care provider on file. Primary Cardiologist: Dr. Herbie Baltimore   Discharge Diagnoses    Active Problems:   Acute ST elevation myocardial infarction (STEMI) of inferolateral wall (HCC)   CAD S/P DES PCI -RCA (In setting of Inferolateral STEMI   Hyperlipidemia   Hypertension   Allergies Not on File  Diagnostic Studies/Procedures    Cath: 03/03/18  Conclusion     Culprit Lesion: Prox RCA lesion is 100% stenosed (thombotic).  PCI: A drug-eluting stent was successfully placed using a STENT SIERRA 4.00 X 18 MM. -> Postdilated to 4.2 mm  Post intervention, there is a 0% residual stenosis.  ______________________________________________________________________________  Suezanne Jacquet 1st Diag lesion is 50% stenosed.  There is mild left ventricular systolic dysfunction. EF: 45-50% by visual estimate, with inferior hypokinesis  LV end diastolic pressure is mildly elevated.  A drug-eluting stent was successfully placed using a STENT SIERRA 4.00 X 18 MM.   Severe Single Vessel CAD - 100% thrombotic pRCA (very large caliber dominant vessel)--> successful DES PCI -- prolonged Pause post PCI - resolved with 1/2 amp Atropine Preserved LVEF with expected inferior -inferolateral Hypokinesis.  Plan: Admit to CCU - consider Fast Track D/c  Cangreal x 2 hr (was given Brilinta post PCI) -- DAPT x 1 yr  Cycle Troponin levels (expect "washout")  Start Low-dose Metoprolol - 25 mg BID  Check Fasting Lipid Panel - start Atorvastatin 80 mg  2D Echo ordered   Bryan Lemma, M.D., M.S.   TTE: 03/04/18  Study Conclusions  - Left ventricle: The cavity size was normal. Systolic function was   mildly reduced. The estimated ejection fraction was in the range   of 45% to 50%. There is severe hypokinesis of the  inferolateral   and inferior myocardium. - Aortic valve: Trileaflet; normal thickness, mildly calcified   leaflets. - Mitral valve: There was trivial regurgitation. - Tricuspid valve: There was trivial regurgitation. _____________   History of Present Illness     47 yo male with no PMH who presented to the ED with chest pain. He lives here locally, noticed chest pain the evening of admission while trying to sleep which didn't improve, prompting presentation.  This was associated with N/V x 1.  Pain was substernal, radiating to posterior neck and jaw.  ECG showed inferolateral STEMI with ~5-6 mm of elevation and involvement of lateral precordial leads. Given ASA, 4,000 heparin in EMS.    He denies family history of CAD, no personal past medical history, no smoking, alcohol, cocaine use history.   Hospital Course     Underwent emergent cath with Dr. Herbie Baltimore noted above with successful PCI/DES x1 to the [facility]. 4.71mm SIERRA stent used. Did have a prolonged pause post PCI and given atropine. Plan for DAPT with ASA/plavix. Placed on low dose BB and high dose statin. Troponin peaked at >65 x4. Follow up echo showed mildly reduced EF at 45-50% with severe hypokinesis of the inferolateral and inferior myocardium. LDL noted at 111. Worked well with cardiac rehab without recurrent chest pain. Spanish interpretor used at the bedside on the day of DC with Dr. Allyson Sabal at the bedside. He was seen by CM for assistance with medications. He was sent to the [facility] for one month Given 30 day Brilinta card, but will need to switch to plavix as outpatient most likely  as he is uninsured.   General: Well developed, well nourished, male appearing in no acute distress. Head: Normocephalic, atraumatic.  Neck: Supple without bruits, JVD. Lungs:  Resp regular and unlabored, CTA. Heart: RRR, S1, S2, no S3, S4, or murmur; no rub. Abdomen: Soft, non-tender, non-distended with normoactive bowel sounds. Extremities: No clubbing,  cyanosis, edema. Distal pedal pulses are 2+ bilaterally. R radial cath site stable without bruising or hematoma Neuro: Alert and oriented X 3. Moves all extremities spontaneously. Psych: Normal affect.  Corey Allen was seen by Dr. Allyson SabalBerry and determined stable for discharge home. Follow up in the office has been arranged. Medications are listed below.   _____________  Discharge Vitals Blood pressure (!) 131/103, pulse 69, temperature 98.6 F (37 C), temperature source Oral, resp. rate 17, height 5\' 7"  (1.702 m), weight 138 lb 9.6 oz (62.9 kg), SpO2 99 %.  Filed Weights   03/03/18 0400 03/04/18 1126 03/05/18 0503  Weight: 139 lb 1.8 oz (63.1 kg) 139 lb 6.4 oz (63.2 kg) 138 lb 9.6 oz (62.9 kg)    Labs & Radiologic Studies    CBC Recent Labs    03/03/18 0200 03/03/18 0924  WBC 8.4 11.3*  HGB 14.7 13.6  HCT 42.8 39.6  MCV 89.9 88.4  PLT 233 207   Basic Metabolic Panel Recent Labs    16/08/9603/14/19 0200 03/03/18 0924  NA 141 139  K 3.1* 3.7  CL 106 108  CO2 23 18*  GLUCOSE 166* 143*  BUN 15 9  CREATININE 1.17 0.83  CALCIUM 9.0 8.7*   Liver Function Tests No results for input(s): AST, ALT, ALKPHOS, BILITOT, PROT, ALBUMIN in the last 72 hours. No results for input(s): LIPASE, AMYLASE in the last 72 hours. Cardiac Enzymes Recent Labs    03/03/18 1529 03/03/18 2154 03/04/18 0352  TROPONINI >65.00* >65.00* >65.00*   BNP Invalid input(s): POCBNP D-Dimer No results for input(s): DDIMER in the last 72 hours. Hemoglobin A1C No results for input(s): HGBA1C in the last 72 hours. Fasting Lipid Panel Recent Labs    03/03/18 0924  CHOL 170  HDL 42  LDLCALC 111*  TRIG 84  CHOLHDL 4.0   Thyroid Function Tests No results for input(s): TSH, T4TOTAL, T3FREE, THYROIDAB in the last 72 hours.  Invalid input(s): FREET3 _____________  Dg Chest Port 1 View  Result Date: 03/03/2018 CLINICAL DATA:  Chest pain starting at midnight. EXAM: PORTABLE CHEST 1 VIEW COMPARISON:  None.  FINDINGS: The heart size and mediastinal contours are within normal limits. Mild uncoiling of the thoracic aorta without aneurysm. No pulmonary consolidation or CHF. No effusion or pneumothorax. The visualized skeletal structures are unremarkable. IMPRESSION: No active disease. Electronically Signed   By: Tollie Ethavid  Kwon M.D.   On: 03/03/2018 02:34   Disposition   Pt is being discharged home today in good condition.  Follow-up Plans & Appointments    Follow-up Information    [facility] Follow up on 03/25/2018.   Why:  @ 3:10 for Hospital follow up with PA Sindy Messingoger Gomez. If you can't make this scheduled appointment please reschedule as soon as possible.  Contact information: [place] [place] [phone]       [facility]. Go to.   Why:  this pharmacy location for assistance with medications. Cost witll range from $4.00-$10.00. First fill will be free then speak with financial counselor to get established with blue card.  Contact information: [address]  [place] [address] [phone]       [facility], [place] Follow up on 03/14/2018.   Specialties:  Cardiology, Radiology Why:  at 10am for your follow up appt.  Contact information: [address] [place] [address] [phone]          Discharge Instructions    Amb Referral to Cardiac Rehabilitation   Complete by:  As directed    Diagnosis:   Coronary Stents PTCA STEMI     Call MD for:  redness, tenderness, or signs of infection (pain, swelling, redness, odor or green/yellow discharge around incision site)   Complete by:  As directed    Diet - low sodium heart healthy   Complete by:  As directed    Discharge instructions   Complete by:  As directed    Radial Site Care Refer to this sheet in the next few weeks. These instructions provide you with information on caring for  yourself after your procedure. Your caregiver may also give you more specific instructions. Your treatment has been planned according to current medical practices, but problems sometimes occur. Call your caregiver if you have any problems or questions after your procedure. HOME CARE INSTRUCTIONS You may shower the day after the procedure.Remove the bandage (dressing) and gently wash the site with plain soap and water.Gently pat the site dry.  Do not apply powder or lotion to the site.  Do not submerge the affected site in water for 3 to 5 days.  Inspect the site at least twice daily.  Do not flex or bend the affected arm for 24 hours.  No lifting over 5 pounds (2.3 kg) for 5 days after your procedure.  Do not drive home if you are discharged the same day of the procedure. Have someone else drive you.  You may drive 24 hours after the procedure unless otherwise instructed by your caregiver.  What to expect: Any bruising will usually fade within 1 to 2 weeks.  Blood that collects in the tissue (hematoma) may be painful to the touch. It should usually decrease in size and tenderness within 1 to 2 weeks.  SEEK IMMEDIATE MEDICAL CARE IF: You have unusual pain at the radial site.  You have redness, warmth, swelling, or pain at the radial site.  You have drainage (other than a small amount of blood on the dressing).  You have chills.  You have a fever or persistent symptoms for more than 72 hours.  You have a fever and your symptoms suddenly get worse.  Your arm becomes pale, cool, tingly, or numb.  You have heavy bleeding from the site. Hold pressure on the site.   PLEASE DO NOT MISS ANY DOSES OF YOUR BRILINTA!!!!! Also keep a log of you blood pressures and bring back to your follow up appt. Please call the office with any questions.   Patients taking blood thinners should generally stay away from medicines like ibuprofen, Advil, Motrin, naproxen, and Aleve due to risk of stomach bleeding. You  may take Tylenol as directed or talk to your primary doctor about alternatives.   Increase activity slowly   Complete by:  As directed       Discharge Medications     Medication List    STOP taking these medications   aspirin 325 MG tablet Replaced by:  aspirin 81 MG chewable tablet     TAKE these medications   aspirin 81 MG chewable tablet Chew 1 tablet (81 mg total) by  mouth daily. Start taking on:  03/06/2018 Replaces:  aspirin 325 MG tablet   atorvastatin 80 MG tablet Commonly known as:  LIPITOR Take 1 tablet (80 mg total) by mouth daily at 6 PM.   lisinopril 5 MG tablet Commonly known as:  PRINIVIL,ZESTRIL Take 1 tablet (5 mg total) by mouth daily. Start taking on:  03/06/2018   metoprolol tartrate 50 MG tablet Commonly known as:  LOPRESSOR Take 1 tablet (50 mg total) by mouth 2 (two) times daily.   nitroGLYCERIN 0.4 MG SL tablet Commonly known as:  NITROSTAT Place 1 tablet (0.4 mg total) under the tongue every 5 (five) minutes as needed.   ticagrelor 90 MG Tabs tablet Commonly known as:  BRILINTA Take 1 tablet (90 mg total) by mouth 2 (two) times daily.        Aspirin prescribed at discharge?  Yes High Intensity Statin Prescribed? (Lipitor 40-80mg  or Crestor 20-40mg ): Yes Beta Blocker Prescribed? Yes For EF <40%, was ACEI/ARB Prescribed? Yes ADP Receptor Inhibitor Prescribed? (i.e. Plavix etc.-Includes Medically Managed Patients): Yes For EF <40%, Aldosterone Inhibitor Prescribed? No: EF ok Was EF assessed during THIS hospitalization? Yes Was Cardiac Rehab II ordered? (Included Medically managed Patients): Yes   Outstanding Labs/Studies   FLP/LFTs in 6 weeks if tolerating statin.   Duration of Discharge Encounter   Greater than 30 minutes including physician time.  Signed, Laverda Page NP-C 03/05/2018, 11:56 AM   Agree with note by Laverda Page NP-C  Postop day 2 large inferior STEMI. His troponins were greater than 65. CSF is the 45%  range with an inferior o other significant disease. He is on appropriate pharmacology. He is walked in the hallways with cardiac rehabilitation without limitation and was stable for discharge. We talked about his clinical condition, medications and expectations with an interpreter. He will follow-up with her regular provider in-10 days. We will probably transition him from [place] to Plavix after 30 days because of cost.   Runell Gess, M.D., FACP, [facility], [facility] [address] [place], [place]  [address]  [phone] 03/05/2018 11:59 AM

## 2018-03-05 NOTE — Care Management Note (Signed)
Case Management Note  Patient Details  Name: Corey Allen MRN: [id number] Date of Birth: 03/23/1971  Subjective/Objective:  Pt presented post Stemi. Plan for home on Brilinta- has 30 day free card. Pt is undocumented and will not be approved for patient assistance. No PCP- CM did [facility] a [facility]. Placed on AVS.                   Action/Plan: [facility] and pt will be able to get Rx's filled free x 1. Rx's will need to be [facility]scribed [facility] and then pt can speak with Financial Counselor to get a [employer] Card for Medication Assistance. No further needs from CM at this time.   Expected Discharge Date:                  Expected Discharge Plan:  Home/Self Care  In-House Referral:  NA  Discharge planning Services  CM Consult, Medication Assistance, Follow-up appt scheduled, [facility]  Post Acute Care Choice:  NA Choice offered to:  NA  DME Arranged:  N/A DME Agency:  NA  HH Arranged:  NA HH Agency:  NA  Status of Service:  Completed, signed off  If discussed at Long Length of Stay Meetings, dates discussed:    Additional Comments:  Gala LewandowskyGraves-Bigelow, Mikah Rottinghaus Kaye, RN 03/05/2018, 10:37 AM

## 2018-03-05 NOTE — Progress Notes (Signed)
Pt feels well, walking around room. Ed completed with pt through interpreter. Good understanding. Understands importance of Brilinta. Will refer to [place] CRPII. It seems he doesn't have many risk factors. Pts BP is 133/90. He is planning to go for a walk now.  [id number] Corey Allen CES, ACSM 11:42 AM 03/05/2018

## 2018-03-06 ENCOUNTER — Telehealth (HOSPITAL_COMMUNITY): Payer: Self-pay

## 2018-03-06 NOTE — Telephone Encounter (Signed)
TOC attempt #1: left message to call back 

## 2018-03-06 NOTE — Telephone Encounter (Signed)
Received referral. Patient does not have insurance. Attempted to call patient to offer Cardiac Rehab Maintenance Program - lm on vm

## 2018-03-07 NOTE — Telephone Encounter (Signed)
Left message for pt to call.

## 2018-03-08 NOTE — Telephone Encounter (Signed)
Left message for pt to call.

## 2018-03-14 ENCOUNTER — Encounter: Payer: Self-pay | Admitting: Physician Assistant

## 2018-03-14 ENCOUNTER — Ambulatory Visit (INDEPENDENT_AMBULATORY_CARE_PROVIDER_SITE_OTHER): Payer: Self-pay | Admitting: Physician Assistant

## 2018-03-14 VITALS — BP 122/82 | HR 67 | Ht 67.0 in | Wt 136.4 lb

## 2018-03-14 DIAGNOSIS — E7800 Pure hypercholesterolemia, unspecified: Secondary | ICD-10-CM

## 2018-03-14 DIAGNOSIS — I2510 Atherosclerotic heart disease of native coronary artery without angina pectoris: Secondary | ICD-10-CM

## 2018-03-14 DIAGNOSIS — I2119 ST elevation (STEMI) myocardial infarction involving other coronary artery of inferior wall: Secondary | ICD-10-CM

## 2018-03-14 DIAGNOSIS — I10 Essential (primary) hypertension: Secondary | ICD-10-CM

## 2018-03-14 DIAGNOSIS — Z79899 Other long term (current) drug therapy: Secondary | ICD-10-CM

## 2018-03-14 LAB — BASIC METABOLIC PANEL
BUN / CREAT RATIO: 20 (ref 9–20)
BUN: 20 mg/dL (ref 6–24)
CHLORIDE: 97 mmol/L (ref 96–106)
CO2: 22 mmol/L (ref 20–29)
Calcium: 9.5 mg/dL (ref 8.7–10.2)
Creatinine, Ser: 0.98 mg/dL (ref 0.76–1.27)
GFR calc Af Amer: 106 mL/min/{1.73_m2} (ref >59)
GFR calc non Af Amer: 91 mL/min/{1.73_m2} (ref >59)
GLUCOSE: 102 mg/dL — AB (ref 65–99)
Potassium: 4.4 mmol/L (ref 3.5–5.2)
Sodium: 135 mmol/L (ref 134–144)

## 2018-03-14 MED ORDER — CLOPIDOGREL BISULFATE 75 MG PO TABS: ORAL_TABLET | ORAL | 3 refills | Status: DC

## 2018-03-14 NOTE — Progress Notes (Signed)
Cardiology Office Note    Date:  03/16/2018   ID:  Corey Allen, DOB 05-07-1971, MRN [id number]  PCP:  Loletta Specter, PA-C  Cardiologist:  Dr. Herbie Baltimore  Chief Complaint  Patient presents with  . Follow-up    Post PCI office followup, denies chest pains, SOB, swelling in hands and feet    History of Present Illness:  Corey Allen is a 47 y.o. male with PMH of CAD, HTN, and HLD.  Patient was recently diagnosed with CAD when he presented to [facility] on 03/03/2018 with inferolateral STEMI.  Emergent cardiac catheterization demonstrated 100% occluded proximal RCA treated with 4.0 x 18 mm DES, 50% ostial D1 lesion, EF 45 to 50% was inferior hypokinesis.  The procedure was difficult and prolonged, after the procedure, he also had a prolonged pause requiring the use of half of amp of atropine.  Postprocedure, he was placed on metoprolol 25 mg twice daily and high-dose statin.  He was also placed on aspirin and Brilinta.  He continued to have intermittent chest pain until the next day requiring the use of IV nitroglycerin.  Echocardiogram obtained on 03/04/2018 showed EF 45 to 50%, severe hypokinesis of the inferolateral and inferior myocardium.  Post procedure, he also had persistently elevated troponin at greater than 65 on multiple blood draw.  LDL was 111.  Patient presents today for cardiology office visit.  He denies any further chest pain since his discharge.  He will update his phone number in the system today as we have been unable to reach him.  Apparently the previous phone number documented in the system was his brother's phone number and his brother works third shift therefore usually go to sleep around 8 AM.  Otherwise he has been compliant with aspirin and Brilinta.  Since discharge, he has been compliant with dual antiplatelet therapy.  He has not needed to use any nitroglycerin since his discharge.  He is on a good regimen of cardiac medications at this time.  Since he is  on lisinopril, I will obtain basic metabolic panel today to assess renal function and electrolytes.  I have given him a paper prescription for Plavix with instruction of a loading dose of 300 mg followed by 75 mg daily thereafter.  However I instructed the patient not to take Plavix and Brilinta at the same time.  We will try to keep him on the Brilinta for as long as we can with samples however it is too expensive for him.  Once he ran out of samples then he will transition to the Plavix.  I instructed him that there should be no interruption in between the transition.   Past Medical History:  Diagnosis Date  . Acute ST elevation myocardial infarction (STEMI) of inferolateral wall (HCC) 03/03/2018  . CAD S/P DES PCI -RCA (In setting of Inferolateral STEMI 03/03/2018   pRCA (Xience SIERRA 4.0 x 18 - 4.2 mm)  . Hyperlipidemia   . Hypertension     Past Surgical History:  Procedure Laterality Date  . CORONARY/GRAFT ACUTE MI REVASCULARIZATION N/A 03/03/2018   Procedure: Coronary/Graft Acute MI Revascularization;  Surgeon: Marykay Lex, MD;  Location: [facility] INVASIVE CV LAB;  Service: Cardiovascular;  Laterality: N/A;  . LEFT HEART CATH AND CORONARY ANGIOGRAPHY N/A 03/03/2018   Procedure: LEFT HEART CATH AND CORONARY ANGIOGRAPHY;  Surgeon: Marykay Lex, MD;  Location: [facility] INVASIVE CV LAB;  Service: Cardiovascular;  Laterality: N/A;    Current Medications: Outpatient Medications Prior to Visit  Medication Sig Dispense Refill  . aspirin 81 MG chewable tablet Chew 1 tablet (81 mg total) by mouth daily.    Marland Kitchen atorvastatin (LIPITOR) 80 MG tablet Take 1 tablet (80 mg total) by mouth daily at 6 PM. 30 tablet 0  . lisinopril (PRINIVIL,ZESTRIL) 5 MG tablet Take 1 tablet (5 mg total) by mouth daily. 30 tablet 0  . metoprolol tartrate (LOPRESSOR) 50 MG tablet Take 1 tablet (50 mg total) by mouth 2 (two) times daily. 60 tablet 0  . nitroGLYCERIN (NITROSTAT) 0.4 MG SL tablet Place 1 tablet (0.4 mg total)  under the tongue every 5 (five) minutes as needed. 25 tablet 0  . ticagrelor (BRILINTA) 90 MG TABS tablet Take 1 tablet (90 mg total) by mouth 2 (two) times daily. 60 tablet 0   No facility-administered medications prior to visit.      Allergies:   Patient has no known allergies.   Social History   Socioeconomic History  . Marital status: Single    Spouse name: Not on file  . Number of children: Not on file  . Years of education: Not on file  . Highest education level: Not on file  Occupational History  . Not on file  Social Needs  . Financial resource strain: Not on file  . Food insecurity:    Worry: Not on file    Inability: Not on file  . Transportation needs:    Medical: Not on file    Non-medical: Not on file  Tobacco Use  . Smoking status: Never Smoker  . Smokeless tobacco: Never Used  Substance and Sexual Activity  . Alcohol use: Yes    Alcohol/week: 0.6 oz    Types: 1 Cans of beer per week  . Drug use: Never  . Sexual activity: Not on file  Lifestyle  . Physical activity:    Days per week: Not on file    Minutes per session: Not on file  . Stress: Not on file  Relationships  . Social connections:    Talks on phone: Not on file    Gets together: Not on file    Attends religious service: Not on file    Active member of club or organization: Not on file    Attends meetings of clubs or organizations: Not on file    Relationship status: Not on file  Other Topics Concern  . Not on file  Social History Narrative  . Not on file     Family History:  The patient's family history includes Hypertension in his father.   ROS:   Please see the history of present illness.    ROS All other systems reviewed and are negative.   PHYSICAL EXAM:   VS:  BP 122/82 (BP Location: Left Arm)   Pulse 67   Ht 5\' 7"  (1.702 m)   Wt 136 lb 6.4 oz (61.9 kg)   BMI 21.36 kg/m    GEN: Well nourished, well developed, in no acute distress  HEENT: normal  Neck: no JVD, carotid  bruits, or masses Cardiac: RRR; no murmurs, rubs, or gallops,no edema  Respiratory:  clear to auscultation bilaterally, normal work of breathing GI: soft, nontender, nondistended, + BS MS: no deformity or atrophy  Skin: warm and dry, no rash Neuro:  Alert and Oriented x 3, Strength and sensation are intact Psych: euthymic mood, full affect  Wt Readings from Last 3 Encounters:  03/14/18 136 lb 6.4 oz (61.9 kg)  03/05/18 138 lb 9.6 oz (62.9  kg)      Studies/Labs Reviewed:   EKG:  EKG is ordered today.  The ekg ordered today demonstrates normal sinus rhythm was T wave inversion in inferolateral leads  Recent Labs: 03/03/2018: Hemoglobin 13.6; Platelets 207 03/14/2018: BUN 20; Creatinine, Ser 0.98; Potassium 4.4; Sodium 135   Lipid Panel    Component Value Date/Time   CHOL 170 03/03/2018 0924   TRIG 84 03/03/2018 0924   HDL 42 03/03/2018 0924   CHOLHDL 4.0 03/03/2018 0924   VLDL 17 03/03/2018 0924   LDLCALC 111 (H) 03/03/2018 0924    Additional studies/ records that were reviewed today include:   Cath 03/03/2018 Conclusion     Culprit Lesion: Prox RCA lesion is 100% stenosed (thombotic).  PCI: A drug-eluting stent was successfully placed using a STENT SIERRA 4.00 X 18 MM. -> Postdilated to 4.2 mm  Post intervention, there is a 0% residual stenosis.  ______________________________________________________________________________  Suezanne Jacquetst 1st Diag lesion is 50% stenosed.  There is mild left ventricular systolic dysfunction. EF: 45-50% by visual estimate, with inferior hypokinesis  LV end diastolic pressure is mildly elevated.  A drug-eluting stent was successfully placed using a STENT SIERRA 4.00 X 18 MM.   Severe Single Vessel CAD - 100% thrombotic pRCA (very large caliber dominant vessel)--> successful DES PCI -- prolonged Pause post PCI - resolved with 1/2 amp Atropine Preserved LVEF with expected inferior -inferolateral Hypokinesis.  Plan: Admit to CCU - consider  Fast Track D/c  Cangreal x 2 hr (was given Brilinta post PCI) -- DAPT x 1 yr  Cycle Troponin levels (expect "washout")  Start Low-dose Metoprolol - 25 mg BID  Check Fasting Lipid Panel - start Atorvastatin 80 mg  2D Echo ordered     Echo 03/04/2018 LV EF: 45% -   50%  Study Conclusions  - Left ventricle: The cavity size was normal. Systolic function was   mildly reduced. The estimated ejection fraction was in the range   of 45% to 50%. There is severe hypokinesis of the inferolateral   and inferior myocardium. - Aortic valve: Trileaflet; normal thickness, mildly calcified   leaflets. - Mitral valve: There was trivial regurgitation. - Tricuspid valve: There was trivial regurgitation.    ASSESSMENT:    1. STEMI involving oth coronary artery of inferior wall (HCC)   2. Medication management   3. Pure hypercholesterolemia   4. Coronary artery disease involving native coronary artery of native heart without angina pectoris   5. Hypertension, essential      PLAN:  In order of problems listed above:  1. CAD: On aspirin and Brilinta, will try to keep him on Brilinta as long as possible, however due to financial restraint, likely will transition to Plavix at some point.  I have given him a paper prescription for Plavix however informed the patient not to start on this medication until he ran out of Brilinta.  The Plavix will need to be started with a loading dose of 300 mg followed by 75mg  daily.  Otherwise he has not had any further chest pain since recent hospital discharge.  He is on high-dose statin  2. Hypertension: On metoprolol and lisinopril, obtain basic metabolic panel to check renal function and electrolytes.  3. Hyperlipidemia: On 80 mg daily of Lipitor, 6 to 8 weeks fasting lipid panel and LFT    Medication Adjustments/Labs and Tests Ordered: Current medicines are reviewed at length with the patient today.  Concerns regarding medicines are outlined above.   Medication changes, Labs  and Tests ordered today are listed in the Patient Instructions below. Patient Instructions  Medication Instructions:  Continue current medications  If you need a refill on your cardiac medications before your next appointment, please call your pharmacy.  Labwork: BMP today HERE IN OUR OFFICE AT [facility]  Take the provided lab slips with you to the lab for your blood draw.   Fast lipid panel and liver function in 6-8 weeks. You will need to fast. DO NOT EAT OR DRINK PAST MIDNIGHT.    Testing/Procedures: None ordered   Follow-Up: Your physician wants you to follow-up in: 2 months with Dr.Harding.    Thank you for choosing [facility] at [employer], [place], [place]  03/16/2018 8:22 PM    [facility] [address] [place], [place], [place]  [address] Phone: [phone]; Fax: [phone]

## 2018-03-14 NOTE — Patient Instructions (Signed)
Medication Instructions:  Continue current medications  If you need a refill on your cardiac medications before your next appointment, please call your pharmacy.  Labwork: BMP today HERE IN OUR OFFICE AT [facility]  Take the provided lab slips with you to the lab for your blood draw.   Fast lipid panel and liver function in 6-8 weeks. You will need to fast. DO NOT EAT OR DRINK PAST MIDNIGHT.    Testing/Procedures: None ordered   Follow-Up: Your physician wants you to follow-up in: 2 months with Dr.Harding.    Thank you for choosing [facility]!!

## 2018-03-16 ENCOUNTER — Encounter: Payer: Self-pay | Admitting: Physician Assistant

## 2018-03-22 ENCOUNTER — Encounter (HOSPITAL_COMMUNITY): Payer: Self-pay

## 2018-03-22 ENCOUNTER — Telehealth (HOSPITAL_COMMUNITY): Payer: Self-pay

## 2018-03-22 NOTE — Telephone Encounter (Signed)
2nd attempt to call patient in regards Cardiac Rehab - Vm has not been set up. Sending letter.

## 2018-03-25 ENCOUNTER — Encounter (INDEPENDENT_AMBULATORY_CARE_PROVIDER_SITE_OTHER): Payer: Self-pay | Admitting: Physician Assistant

## 2018-03-25 ENCOUNTER — Ambulatory Visit (INDEPENDENT_AMBULATORY_CARE_PROVIDER_SITE_OTHER): Payer: Self-pay | Admitting: Physician Assistant

## 2018-03-25 VITALS — BP 115/71 | HR 66 | Temp 98.4°F | Resp 18 | Ht 67.0 in | Wt 134.0 lb

## 2018-03-25 DIAGNOSIS — Z09 Encounter for follow-up examination after completed treatment for conditions other than malignant neoplasm: Secondary | ICD-10-CM

## 2018-03-25 DIAGNOSIS — I2510 Atherosclerotic heart disease of native coronary artery without angina pectoris: Secondary | ICD-10-CM

## 2018-03-25 DIAGNOSIS — I2111 ST elevation (STEMI) myocardial infarction involving right coronary artery: Secondary | ICD-10-CM

## 2018-03-25 DIAGNOSIS — Z23 Encounter for immunization: Secondary | ICD-10-CM

## 2018-03-25 MED ORDER — ATORVASTATIN CALCIUM 80 MG PO TABS: 80.0000 mg | ORAL_TABLET | Freq: Every day | ORAL | 11 refills | Status: DC

## 2018-03-25 MED ORDER — LISINOPRIL 5 MG PO TABS
5.0000 mg | ORAL_TABLET | Freq: Every day | ORAL | 11 refills | Status: DC
Start: 2018-03-25 — End: 2018-05-14

## 2018-03-25 MED ORDER — TICAGRELOR 90 MG PO TABS: 90.0000 mg | ORAL_TABLET | Freq: Two times a day (BID) | ORAL | 5 refills | Status: DC

## 2018-03-25 MED ORDER — METOPROLOL TARTRATE 50 MG PO TABS: 50.0000 mg | ORAL_TABLET | Freq: Two times a day (BID) | ORAL | 11 refills | Status: DC

## 2018-03-25 MED ORDER — ASPIRIN 81 MG PO CHEW: 81.0000 mg | CHEWABLE_TABLET | Freq: Every day | ORAL | 11 refills | Status: AC

## 2018-03-25 NOTE — Patient Instructions (Signed)
Td Vaccine (Tetanus and Diphtheria): What You Need to Know 1. Why get vaccinated? Tetanus  and diphtheria are very serious diseases. They are rare in the [place] today, but people who do become infected often have severe complications. Td vaccine is used to protect adolescents and adults from both of these diseases. Both tetanus and diphtheria are infections caused by bacteria. Diphtheria spreads from person to person through coughing or sneezing. Tetanus-causing bacteria enter the body through cuts, scratches, or wounds. TETANUS (lockjaw) causes painful muscle tightening and stiffness, usually all over the body.  It can lead to tightening of muscles in the head and neck so you can't open your mouth, swallow, or sometimes even breathe. Tetanus kills about 1 out of every 10 people who are infected even after receiving the best medical care.  DIPHTHERIA can cause a thick coating to form in the back of the throat.  It can lead to breathing problems, paralysis, heart failure, and death.  Before vaccines, as many as 200,000 cases of diphtheria and hundreds of cases of tetanus were reported in the [place] each year. Since vaccination began, reports of cases for both diseases have dropped by about 99%. 2. Td vaccine Td vaccine can protect adolescents and adults from tetanus and diphtheria. Td is usually given as a booster dose every 10 years but it can also be given earlier after a severe and dirty wound or burn. Another vaccine, called Tdap, which protects against pertussis in addition to tetanus and diphtheria, is sometimes recommended instead of Td vaccine. Your doctor or the person giving you the vaccine can give you more information. Td may safely be given at the same time as other vaccines. 3. Some people should not get this vaccine  A person who has ever had a life-threatening allergic reaction after a previous dose of any tetanus or diphtheria containing vaccine, OR has a severe  allergy to any part of this vaccine, should not get Td vaccine. Tell the person giving the vaccine about any severe allergies.  Talk to your doctor if you: ? had severe pain or swelling after any vaccine containing diphtheria or tetanus, ? ever had a condition called Guillain Barre Syndrome (GBS), ? aren't feeling well on the day the shot is scheduled. 4. What are the risks from Td vaccine? With any medicine, including vaccines, there is a chance of side effects. These are usually mild and go away on their own. Serious reactions are also possible but are rare. Most people who get Td vaccine do not have any problems with it. Mild problems following Td vaccine: (Did not interfere with activities)  Pain where the shot was given (about 8 people in 10)  Redness or swelling where the shot was given (about 1 person in 4)  Mild fever (rare)  Headache (about 1 person in 4)  Tiredness (about 1 person in 4)  Moderate problems following Td vaccine: (Interfered with activities, but did not require medical attention)  Fever over 102F (rare)  Severe problems following Td vaccine: (Unable to perform usual activities; required medical attention)  Swelling, severe pain, bleeding and/or redness in the arm where the shot was given (rare).  Problems that could happen after any vaccine:  People sometimes faint after a medical procedure, including vaccination. Sitting or lying down for about 15 minutes can help prevent fainting, and injuries caused by a fall. Tell your doctor if you feel dizzy, or have vision changes or ringing in the ears.  Some people get   severe pain in the shoulder and have difficulty moving the arm where a shot was given. This happens very rarely.  Any medication can cause a severe allergic reaction. Such reactions from a vaccine are very rare, estimated at fewer than 1 in a million doses, and would happen within a few minutes to a few hours after the vaccination. As with any  medicine, there is a very remote chance of a vaccine causing a serious injury or death. The safety of vaccines is always being monitored. For more information, visit: www.cdc.gov/vaccinesafety/ 5. What if there is a serious reaction? What should I look for? Look for anything that concerns you, such as signs of a severe allergic reaction, very high fever, or unusual behavior. Signs of a severe allergic reaction can include hives, swelling of the face and throat, difficulty breathing, a fast heartbeat, dizziness, and weakness. These would usually start a few minutes to a few hours after the vaccination. What should I do?  If you think it is a severe allergic reaction or other emergency that can't wait, call 9-1-1 or get the person to the nearest hospital. Otherwise, call your doctor.  Afterward, the reaction should be reported to the Vaccine Adverse Event Reporting System (VAERS). Your doctor might file this report, or you can do it yourself through the VAERS web site at www.vaers.hhs.gov, or by calling 1-800-822-7967. ? VAERS does not give medical advice. 6. The National Vaccine Injury Compensation Program The National Vaccine Injury Compensation Program (VICP) is a federal program that was created to compensate people who may have been injured by certain vaccines. Persons who believe they may have been injured by a vaccine can learn about the program and about filing a claim by calling 1-800-338-2382 or visiting the VICP website at www.hrsa.gov/vaccinecompensation. There is a time limit to file a claim for compensation. 7. How can I learn more?  Ask your doctor. He or she can give you the vaccine package insert or suggest other sources of information.  Call your local or state health department.  Contact the Centers for Disease Control and Prevention (CDC): ? Call 1-800-232-4636 (1-800-CDC-INFO) ? Visit CDC's website at www.cdc.gov/vaccines CDC Td Vaccine VIS (02/29/16) This information is  not intended to replace advice given to you by your health care provider. Make sure you discuss any questions you have with your health care provider. Document Released: 09/03/2006 Document Revised: 07/27/2016 Document Reviewed: 07/27/2016 Elsevier Interactive Patient Education  2017 Elsevier Inc.  

## 2018-03-25 NOTE — Progress Notes (Signed)
Subjective:  Patient ID: Corey Allen, male    DOB: 1971/05/10  Age: 47 y.o. MRN: [id number]  CC: hospital f/u  HPI Corey Allen is a 47 y.o. male with a medical history of HLD and HTN presents as a new patient on hospital f/u. Admitted on 03/03/18 for chest pain and discharged on 03/05/18 with diagnosis of STEMI of inferolateral wall. Pt underwent DES PCI-RCA. One episode of exertional dyspneaPatient has been feeling much better after PCI. He feels more energized. Denies CP, palpitations, current SOB, HA, tingling, numbness, weakness, paralysis, abdominal pain, f/c/n/v, rash, or GI/GU sxs.       Outpatient Medications Prior to Visit  Medication Sig Dispense Refill  . aspirin 81 MG chewable tablet Chew 1 tablet (81 mg total) by mouth daily.    Marland Kitchen atorvastatin (LIPITOR) 80 MG tablet Take 1 tablet (80 mg total) by mouth daily at 6 PM. 30 tablet 0  . clopidogrel (PLAVIX) 75 MG tablet Take 4 tablets first day ( ), then take one tablet by mouth daily ( ) 90 tablet 3  . lisinopril (PRINIVIL,ZESTRIL) 5 MG tablet Take 1 tablet (5 mg total) by mouth daily. 30 tablet 0  . metoprolol tartrate (LOPRESSOR) 50 MG tablet Take 1 tablet (50 mg total) by mouth 2 (two) times daily. 60 tablet 0  . nitroGLYCERIN (NITROSTAT) 0.4 MG SL tablet Place 1 tablet (0.4 mg total) under the tongue every 5 (five) minutes as needed. 25 tablet 0  . ticagrelor (BRILINTA) 90 MG TABS tablet Take 1 tablet (90 mg total) by mouth 2 (two) times daily. 60 tablet 0   No facility-administered medications prior to visit.      ROS Review of Systems  Constitutional: Negative for chills, fever and malaise/fatigue.  Eyes: Negative for blurred vision.  Respiratory: Negative for shortness of breath.   Cardiovascular: Negative for chest pain and palpitations.  Gastrointestinal: Negative for abdominal pain and nausea.  Genitourinary: Negative for dysuria and hematuria.  Musculoskeletal: Negative for joint pain and myalgias.   Skin: Negative for rash.  Neurological: Negative for tingling and headaches.  Psychiatric/Behavioral: Negative for depression. The patient is not nervous/anxious.     Objective:  BP 115/71 (BP Location: Left Arm, Patient Position: Sitting, Cuff Size: Normal)   Pulse 66   Temp 98.4 F (36.9 C) (Oral)   Resp 18   Ht  (1.702 m)   Wt 134 lb (60.8 kg)   SpO2 98%   BMI 20.99 kg/m   BP/Weight 03/25/2018 03/14/2018 03/05/2018  Systolic BP 115 122 131  Diastolic BP 71 82 103  Wt. (Lbs) 134 136.4 138.6  BMI 20.99 21.36 -      Physical Exam  Constitutional: He is oriented to person, place, and time.  Well developed, well nourished, NAD, polite  HENT:  Head: Normocephalic and atraumatic.  Eyes: No scleral icterus.  Neck: Normal range of motion. Neck supple. No thyromegaly present.  Cardiovascular: Normal rate, regular rhythm and normal heart sounds.  Pulmonary/Chest: Effort normal and breath sounds normal.  Abdominal: Soft. Bowel sounds are normal. There is no tenderness.  Musculoskeletal: He exhibits no edema.  Neurological: He is alert and oriented to person, place, and time.  Skin: Skin is warm and dry. No rash noted. No erythema. No pallor.  Psychiatric: He has a normal mood and affect. His behavior is normal. Thought content normal.  Vitals reviewed.    Assessment & Plan:   1. Hospital discharge follow-up - notes reviewed.   2. ST elevation myocardial  infarction involving RCA - Advised patient to continue on current medication regimen.  - Refill ticagrelor (BRILINTA) 90 MG TABS tablet; Take 1 tablet (90 mg total) by mouth 2 (two) times daily.  Dispense: 60 tablet; Refill: 5 - Refill metoprolol tartrate (LOPRESSOR) 50 MG tablet; Take 1 tablet (50 mg total) by mouth 2 (two) times daily.  Dispense: 60 tablet; Refill: 11 -Refill lisinopril (PRINIVIL,ZESTRIL) 5 MG tablet; Take 1 tablet (5 mg total) by mouth daily.  Dispense: 30 tablet; Refill: 11 - Refill atorvastatin  (LIPITOR) 80 MG tablet; Take 1 tablet (80 mg total) by mouth daily at 6 PM.  Dispense: 30 tablet; Refill: 11 - Refill aspirin 81 MG chewable tablet; Chew 1 tablet (81 mg total) by mouth daily.  Dispense: 30 tablet; Refill: 11  3.Coronary artery disease involving native heart without angina pectoris, unspecified vessel or lesion type - Refill ticagrelor (BRILINTA) 90 MG TABS tablet; Take 1 tablet (90 mg total) by mouth 2 (two) times daily.  Dispense: 60 tablet; Refill: 5 - Refill metoprolol tartrate (LOPRESSOR) 50 MG tablet; Take 1 tablet (50 mg total) by mouth 2 (two) times daily.  Dispense: 60 tablet; Refill: 11 - Refill lisinopril (PRINIVIL,ZESTRIL) 5 MG tablet; Take 1 tablet (5 mg total) by mouth daily.  Dispense: 30 tablet; Refill: 11 - Refill atorvastatin (LIPITOR) 80 MG tablet; Take 1 tablet (80 mg total) by mouth daily at 6 PM.  Dispense: 30 tablet; Refill: 11 - Refill aspirin 81 MG chewable tablet; Chew 1 tablet (81 mg total) by mouth daily.  Dispense: 30 tablet; Refill: 11  3. Need for Tdap vaccination - Tdap vaccine greater than or equal to 7yo IM   Meds ordered this encounter  Medications  . ticagrelor (BRILINTA) 90 MG TABS tablet    Sig: Take 1 tablet (90 mg total) by mouth 2 (two) times daily.    Dispense:  60 tablet    Refill:  5    Order Specific Question:   Supervising Provider    Answer:   Quentin Angst [id number]  . metoprolol tartrate (LOPRESSOR) 50 MG tablet    Sig: Take 1 tablet (50 mg total) by mouth 2 (two) times daily.    Dispense:  60 tablet    Refill:  11    Order Specific Question:   Supervising Provider    Answer:   Quentin Angst [id number]  . lisinopril (PRINIVIL,ZESTRIL) 5 MG tablet    Sig: Take 1 tablet (5 mg total) by mouth daily.    Dispense:  30 tablet    Refill:  11    Order Specific Question:   Supervising Provider    Answer:   Quentin Angst [id number]  . atorvastatin (LIPITOR) 80 MG tablet    Sig: Take 1 tablet (80 mg total) by  mouth daily at 6 PM.    Dispense:  30 tablet    Refill:  11    Order Specific Question:   Supervising Provider    Answer:   Quentin Angst [[id number]]  . aspirin 81 MG chewable tablet    Sig: Chew 1 tablet (81 mg total) by mouth daily.    Dispense:  30 tablet    Refill:  11    Order Specific Question:   Supervising Provider    Answer:   Quentin Angst [[id number]]    Follow-up: Return in about 4 months (around 07/26/2018) for annual physical exam.   Loletta Specter PA

## 2018-04-02 MED FILL — ATORVASTATIN 80 MG TABLET: 80 | 30 days supply | Qty: 30 | Fill #0

## 2018-04-02 MED FILL — METOPROLOL TARTRATE 50 MG T: 50 | 30 days supply | Qty: 60 | Fill #0

## 2018-04-02 MED FILL — LISINOPRIL 5 MG TAB: 5 | 30 days supply | Qty: 30 | Fill #0

## 2018-04-23 ENCOUNTER — Telehealth (HOSPITAL_COMMUNITY): Payer: Self-pay

## 2018-04-23 NOTE — Telephone Encounter (Signed)
Called and spoke with patient in regards to Cardiac Rehab - Patient stated he is not interested. Closed referral. °

## 2018-05-01 ENCOUNTER — Telehealth: Payer: Self-pay | Admitting: Cardiology

## 2018-05-01 DIAGNOSIS — I2510 Atherosclerotic heart disease of native coronary artery without angina pectoris: Secondary | ICD-10-CM

## 2018-05-01 MED ORDER — TICAGRELOR 90 MG PO TABS: 90.0000 mg | ORAL_TABLET | Freq: Two times a day (BID) | ORAL | 0 refills | Status: DC

## 2018-05-01 NOTE — Telephone Encounter (Signed)
New Message   Patient calling the office for samples of medication:   1.  What medication and dosage are you requesting samples for? Brilinta   2.  Are you currently out of this medication? Two days left

## 2018-05-01 NOTE — Telephone Encounter (Signed)
SPOKE  Corey Allen-  sample are available for pick up . RN INSTRUCTED TO CALL PHARMACY AN FIND OUT THE PRICE OF MEDICATION ,AS WELL A PATIENT ASSIST ASSISTANCE FORM WILL BE PLACED IN SAMPLE BAG TO FILL OUT TO SEE IF ASSISTANCE IS AVAILABLE.  VERBALIZED UNDERSTANDING.- SAMPLES MAY NOT BE AVAILABLE ALL THE TIME.

## 2018-05-02 LAB — HEPATIC FUNCTION PANEL
ALBUMIN: 4.4 g/dL (ref 3.5–5.5)
ALK PHOS: 210 IU/L — AB (ref 39–117)
ALT: 75 IU/L — ABNORMAL HIGH (ref 0–44)
AST: 53 IU/L — ABNORMAL HIGH (ref 0–40)
BILIRUBIN, DIRECT: 0.24 mg/dL (ref 0.00–0.40)
Bilirubin Total: 0.8 mg/dL (ref 0.0–1.2)
TOTAL PROTEIN: 7.6 g/dL (ref 6.0–8.5)

## 2018-05-02 LAB — LIPID PANEL
Chol/HDL Ratio: 2.9 ratio (ref 0.0–5.0)
Cholesterol, Total: 89 mg/dL — ABNORMAL LOW (ref 100–199)
HDL: 31 mg/dL — AB (ref >39)
LDL Calculated: 41 mg/dL (ref 0–99)
Triglycerides: 83 mg/dL (ref 0–149)
VLDL Cholesterol Cal: 17 mg/dL (ref 5–40)

## 2018-05-03 ENCOUNTER — Other Ambulatory Visit: Payer: Self-pay

## 2018-05-03 DIAGNOSIS — Z79899 Other long term (current) drug therapy: Secondary | ICD-10-CM

## 2018-05-14 ENCOUNTER — Encounter: Payer: Self-pay | Admitting: Cardiology

## 2018-05-14 ENCOUNTER — Ambulatory Visit (INDEPENDENT_AMBULATORY_CARE_PROVIDER_SITE_OTHER): Payer: Self-pay | Admitting: Cardiology

## 2018-05-14 VITALS — BP 150/80 | HR 64 | Ht 67.0 in | Wt 128.2 lb

## 2018-05-14 DIAGNOSIS — I2510 Atherosclerotic heart disease of native coronary artery without angina pectoris: Secondary | ICD-10-CM

## 2018-05-14 DIAGNOSIS — I10 Essential (primary) hypertension: Secondary | ICD-10-CM

## 2018-05-14 DIAGNOSIS — I2119 ST elevation (STEMI) myocardial infarction involving other coronary artery of inferior wall: Secondary | ICD-10-CM

## 2018-05-14 DIAGNOSIS — E785 Hyperlipidemia, unspecified: Secondary | ICD-10-CM

## 2018-05-14 DIAGNOSIS — Z9861 Coronary angioplasty status: Secondary | ICD-10-CM

## 2018-05-14 MED ORDER — LISINOPRIL 5 MG PO TABS: 5.0000 mg | ORAL_TABLET | Freq: Every day | ORAL | 3 refills | Status: DC

## 2018-05-14 MED ORDER — CLOPIDOGREL BISULFATE 75 MG PO TABS: 75.0000 mg | ORAL_TABLET | Freq: Every day | ORAL | 3 refills | Status: DC

## 2018-05-14 MED ORDER — ATORVASTATIN CALCIUM 40 MG PO TABS: 40.0000 mg | ORAL_TABLET | Freq: Every day | ORAL | 3 refills | Status: DC

## 2018-05-14 MED ORDER — ATORVASTATIN CALCIUM 80 MG PO TABS: 40.0000 mg | ORAL_TABLET | Freq: Every day | ORAL | 11 refills | Status: DC

## 2018-05-14 MED ORDER — CLOPIDOGREL BISULFATE 75 MG PO TABS: 75.0000 mg | ORAL_TABLET | Freq: Every day | ORAL | 11 refills | Status: DC

## 2018-05-14 MED ORDER — METOPROLOL TARTRATE 50 MG PO TABS: 50.0000 mg | ORAL_TABLET | Freq: Two times a day (BID) | ORAL | 3 refills | Status: DC

## 2018-05-14 MED FILL — LISINOPRIL 5 MG TAB: 5 | 30 days supply | Qty: 30 | Fill #0

## 2018-05-14 MED FILL — CLOPIDOGREL 75 MG TABLET: 75 | 30 days supply | Qty: 30 | Fill #0

## 2018-05-14 MED FILL — METOPROLOL TARTRATE 50 MG T: 50 | 30 days supply | Qty: 60 | Fill #0

## 2018-05-14 MED FILL — ATORVASTATIN CALCIUM 40 MG: 40 | 30 days supply | Qty: 30 | Fill #0

## 2018-05-14 NOTE — Assessment & Plan Note (Signed)
Notable inferior exam.  I suspect that some of this will improve, but I do think he probably will have a full-thickness partial thickness inferior infarct based on the severity of his MI. No active heart failure symptoms.  No other CAD besides mild-Moderate diagonal disease.

## 2018-05-14 NOTE — Assessment & Plan Note (Signed)
No active angina status post large DES PCI to RCA. Currently getting Brilinta samples, but will probably last about another 2 weeks.  At that time we will convert to Plavix with loading dose initially.  Regardless of the other Agent, continue 81 mg aspirin  On stable dose of beta-blocker and ACE inhibitor.  He is on high-dose statin which can be reduced.

## 2018-05-14 NOTE — Assessment & Plan Note (Signed)
His blood pressure is high here today which is unusual.  He is somewhat stressed and anxious today seeing me for the first time since the hospital. My recommendation will be to monitor and only titrate ACE inhibitor up to 10 mg if pressures been elevated.

## 2018-05-14 NOTE — Assessment & Plan Note (Signed)
Excellent response to the high-dose statin.  I think we are safe reducing his Lipitor to 40 mg from 80.

## 2018-05-14 NOTE — Patient Instructions (Addendum)
MEDICATION CHANGES  PLEASE TAKE THE BRILINTA TWICE A DAY UNTIL BOTTLE IS EMPTY AND THEN START TAKING PLAVIX ( CLOPIDOGREL ) 75 MG  ONCE DAILY. THE FIRST DAY OF TAKING PLAVIX ,TAKE 4 TABLETS OF THE MEDICATIONS.    DECREASE TAKING  ATORVASTATIN.  TAKE 1/2 TABLET OF 80 MG ATORVASTATIN ONCE DAILY.    CONTINUE ALL OTHER MEDICATIONS. ===( PATIENT NEED ALL MEDICATION E-SENT TO PHARMACY - 90 DAY SUPPLY x 3 refills  change atorvastatin to 40 mg daily,-- lisinopril , metoprolol, plavix--) ===    Your physician recommends that you schedule a follow-up appointment in 4 TO [facility] MON[facility]S [facility]    If you need a refill on your cardiac medications before your next appointment, please call your pharmacy.

## 2018-05-14 NOTE — Progress Notes (Signed)
Is  PCP: Loletta Specter, PA-C  Clinic Note: Chief Complaint  Patient presents with  . Follow-up    no notable symptoms  . Coronary Artery Disease    Inferior STEMI- RCA PCI   HPI: Corey Allen is a 47 y.o. male with a PMH below who presents today for 58-month follow-up after initial PA visit with history of inferior lateral STEMI on March 03, 2018.    Inferolateral STEMI 03/03/2018: Cath showed 100% occluded proximal RCA--> DES PCI 4.0 mm x 18 mm; also noted ostial D1 50%.  EF 45 to 50% with inferior hypokinesis. -->  Complicated by severe bradycardia with prolonged pauses requiring atropine.  Corey Allen was last seen on March 14, 2018 by Azalee Course, PA --> denied any further chest pain.  Noted medication compliance.  Converted from Brilinta to Plavix once samples were complete.  Remains on 80 mg Lipitor along with metoprolol and lisinopril.  Recent Hospitalizations: None since hospitalization for STEMI on April 14-16, 2019  Studies Personally Reviewed - (if available, images/films reviewed: From Epic Chart or Care Everywhere)  CARDIAC CATH-PCI 03/03/2018: pRCA 100% thrombotic (DES PCI -XIENCE SIERRA 4.0 mm 18 mm-4.2 mm), ostial D1 50%.  EF 45 to 50% with inferior HK.  2D ECHO 03/04/2018: EF 45-50%.  Severe inferior-inferolateral HK.  Mild aortic calcification otherwise normal.  Interval History: Corey Allen returns here today doing well.  He is quite anxious by his own admission and therefore somewhat hypotensive.  He states he is back to work now, and is doing fine without any major symptoms.  He says he may occasionally feel a little bit tired or have a funny feeling in his chest, but thinks he may just be overreacting from heartburn type symptoms.  One day initially after discharge he had a dizzy spell did not first week, but otherwise has not had any lightheadedness or dizziness.  He has not had any recurrent symptoms to suggest recurrent symptoms of angina from his heart attack.  No  resting or exertional chest pain or pressure.  No PND, orthopnea or edema.  No palpitations, lightheadedness, dizziness, weakness or syncope/near syncope. No TIA/amaurosis fugax symptoms. No melena, hematochezia, hematuria, or epstaxis. No claudication.  ROS: A comprehensive was performed. Review of Systems  Constitutional: Negative for malaise/fatigue (Energy is definitely getting better) and weight loss.  Respiratory: Negative for cough, shortness of breath and wheezing.   Musculoskeletal: Negative for joint pain.  Neurological: Negative for dizziness.  Psychiatric/Behavioral: The patient is nervous/anxious (Still has some anxiety about his heart attack).   All other systems reviewed and are negative.  I have reviewed and (if needed) personally updated the patient's problem list, medications, allergies, past medical and surgical history, social and family history.   Past Medical History:  Diagnosis Date  . Acute ST elevation myocardial infarction (STEMI) of inferolateral wall (HCC) 03/03/2018  . CAD S/P DES PCI -RCA (In setting of Inferolateral STEMI 03/03/2018   pRCA (Xience SIERRA 4.0 x 18 - 4.2 mm)  . Hyperlipidemia   . Hypertension     Past Surgical History:  Procedure Laterality Date  . CORONARY/GRAFT ACUTE MI REVASCULARIZATION N/A 03/03/2018   Procedure: Coronary/Graft Acute MI Revascularization;  Surgeon: Marykay Lex, MD;  Location: [facility] INVASIVE CV LAB;  Service: Cardiovascular: pRCA 100% thrombotic (DES PCI -XIENCE SIERRA 4.0 mm 18 mm-4.2 mm) -> prolonged pause - 1/2 Amp Atropoine  . LEFT HEART CATH AND CORONARY ANGIOGRAPHY N/A 03/03/2018   Procedure: LEFT HEART CATH AND CORONARY ANGIOGRAPHY;  Surgeon: Marykay Lex, MD;  Location: [facility] INVASIVE CV LAB;  Service: Cardiovascular: pRCA 100% thrombotic (DES PCI), ostial D1 50%.  EF 45 to 50% with inferior HK.  Marland Kitchen TRANSTHORACIC ECHOCARDIOGRAM  03/04/2018   EF 45-50%.  Severe inferior-inferolateral HK.  Mild aortic  calcification otherwise normal.    Current Meds  Medication Sig  . aspirin 81 MG chewable tablet Chew 1 tablet (81 mg total) by mouth daily.  Marland Kitchen lisinopril (PRINIVIL,ZESTRIL) 5 MG tablet Take 1 tablet (5 mg total) by mouth daily.  . metoprolol tartrate (LOPRESSOR) 50 MG tablet Take 1 tablet (50 mg total) by mouth 2 (two) times daily.  . nitroGLYCERIN (NITROSTAT) 0.4 MG SL tablet Place 1 tablet (0.4 mg total) under the tongue every 5 (five) minutes as needed.  . [DISCONTINUED] atorvastatin (LIPITOR) 80 MG tablet Take 1 tablet (80 mg total) by mouth daily at 6 PM.  . [DISCONTINUED] atorvastatin (LIPITOR) 80 MG tablet Take 0.5 tablets (40 mg total) by mouth daily at 6 PM.  . [DISCONTINUED] lisinopril (PRINIVIL,ZESTRIL) 5 MG tablet Take 1 tablet (5 mg total) by mouth daily.  . [DISCONTINUED] metoprolol tartrate (LOPRESSOR) 50 MG tablet Take 1 tablet (50 mg total) by mouth 2 (two) times daily.  . [DISCONTINUED] ticagrelor (BRILINTA) 90 MG TABS tablet Take 1 tablet (90 mg total) by mouth 2 (two) times daily.    No Known Allergies  Social History   Tobacco Use  . Smoking status: Never Smoker  . Smokeless tobacco: Never Used  Substance Use Topics  . Alcohol use: Yes    Alcohol/week: 0.6 oz    Types: 1 Cans of beer per week  . Drug use: Never   Social History   Social History Narrative  . Not on file    family history includes Hypertension in his father.  Wt Readings from Last 3 Encounters:  05/14/18 128 lb 3.2 oz (58.2 kg)  03/25/18 134 lb (60.8 kg)  03/14/18 136 lb 6.4 oz (61.9 kg)    PHYSICAL EXAM -- has not taken his BP medication yet today BP (!) 150/80 (BP Location: Right Arm)   Pulse 64   Ht 5\' 7"  (1.702 m)   Wt 128 lb 3.2 oz (58.2 kg)   BMI 20.08 kg/m  Physical Exam  Constitutional: He is oriented to person, place, and time. He appears well-developed and well-nourished. No distress.  Healthy-appearing.  Well-groomed  HENT:  Head: Normocephalic and atraumatic.    Neck: Normal range of motion. Neck supple. No hepatojugular reflux and no JVD present. Carotid bruit is not present. No thyromegaly present.  Cardiovascular: Normal rate, regular rhythm, normal heart sounds, intact distal pulses and normal pulses.  No extrasystoles are present. PMI is not displaced. Exam reveals no gallop and no friction rub.  No murmur heard. Pulmonary/Chest: Effort normal and breath sounds normal. No respiratory distress. He has no wheezes. He has no rales.  Abdominal: Soft. Bowel sounds are normal. He exhibits no distension. There is no tenderness. There is no rebound.  No HSM  Musculoskeletal: Normal range of motion. He exhibits no edema.  Lymphadenopathy:    He has no cervical adenopathy.  Neurological: He is alert and oriented to person, place, and time.  Psychiatric: He has a normal mood and affect. His behavior is normal. Judgment and thought content normal.  Vitals reviewed.    Adult ECG Report  not checked  Other studies Reviewed: Additional studies/ records that were reviewed today include:  Recent Labs:  ** excellent LIPIDS  Lab Results  Component Value Date   CHOL 89 (L) 05/02/2018   HDL 31 (L) 05/02/2018   LDLCALC 41 05/02/2018   TRIG 83 05/02/2018   CHOLHDL 2.9 05/02/2018   Lab Results  Component Value Date   CREATININE 0.98 03/14/2018   BUN 20 03/14/2018   NA 135 03/14/2018   K 4.4 03/14/2018   CL 97 03/14/2018   CO2 22 03/14/2018    ASSESSMENT / PLAN: Problem List Items Addressed This Visit    ST elevation myocardial infarction (STEMI) of inferolateral wall, subsequent episode of care (HCC) (Chronic)    Notable inferior exam.  I suspect that some of this will improve, but I do think he probably will have a full-thickness partial thickness inferior infarct based on the severity of his MI. No active heart failure symptoms.  No other CAD besides mild-Moderate diagonal disease.      Relevant Medications   metoprolol tartrate (LOPRESSOR) 50  MG tablet   lisinopril (PRINIVIL,ZESTRIL) 5 MG tablet   atorvastatin (LIPITOR) 40 MG tablet   Hyperlipidemia with target LDL less than 70 (Chronic)    Excellent response to the high-dose statin.  I think we are safe reducing his Lipitor to 40 mg from 80.      Relevant Medications   metoprolol tartrate (LOPRESSOR) 50 MG tablet   lisinopril (PRINIVIL,ZESTRIL) 5 MG tablet   atorvastatin (LIPITOR) 40 MG tablet   Essential hypertension (Chronic)    His blood pressure is high here today which is unusual.  He is somewhat stressed and anxious today seeing me for the first time since the hospital. My recommendation will be to monitor and only titrate ACE inhibitor up to 10 mg if pressures been elevated.      Relevant Medications   metoprolol tartrate (LOPRESSOR) 50 MG tablet   lisinopril (PRINIVIL,ZESTRIL) 5 MG tablet   atorvastatin (LIPITOR) 40 MG tablet   CAD S/P DES PCI -RCA (In setting of Inferolateral STEMI - Primary (Chronic)    No active angina status post large DES PCI to RCA. Currently getting Brilinta samples, but will probably last about another 2 weeks.  At that time we will convert to Plavix with loading dose initially.  Regardless of the other Agent, continue 81 mg aspirin  On stable dose of beta-blocker and ACE inhibitor.  He is on high-dose statin which can be reduced.      Relevant Medications   metoprolol tartrate (LOPRESSOR) 50 MG tablet   lisinopril (PRINIVIL,ZESTRIL) 5 MG tablet   atorvastatin (LIPITOR) 40 MG tablet    Other Visit Diagnoses    Coronary artery disease involving native heart without angina pectoris, unspecified vessel or lesion type       Relevant Medications   metoprolol tartrate (LOPRESSOR) 50 MG tablet   lisinopril (PRINIVIL,ZESTRIL) 5 MG tablet   atorvastatin (LIPITOR) 40 MG tablet      I spent a total of with the patient and chart review. >  50% of the time was spent in direct patient consultation.   Current medicines are reviewed  at length with the patient today.  (+/- concerns) - still not sure if he will qualify for med-assist for Brilinta (not citizen) The following changes have been made:  discussed conversion to Plavix (300 mg day 1, then 75 mg daily)  Patient Instructions  MEDICATION CHANGES  PLEASE TAKE THE BRILINTA TWICE A DAY UNTIL BOTTLE IS EMPTY AND THEN START TAKING PLAVIX ( CLOPIDOGREL ) 75 MG  ONCE DAILY. THE FIRST DAY  OF TAKING PLAVIX ,TAKE 4 TABLETS OF THE MEDICATIONS.    DECREASE TAKING  ATORVASTATIN.  TAKE 1/2 TABLET OF 80 MG ATORVASTATIN ONCE DAILY.    CONTINUE ALL OTHER MEDICATIONS. ===( PATIENT NEED ALL MEDICATION E-SENT TO PHARMACY - 90 DAY SUPPLY x 3 refills  change atorvastatin to 40 mg daily,-- lisinopril , metoprolol, plavix--) ===    Your physician recommends that you schedule a follow-up appointment in 4 TO [facility] MONTH[facility]    If you need a refill on your cardiac medications before your next appointment, please call your pharmacy.      Studies Ordered:   No orders of the defined types were placed in this encounter.     Bryan Lemmaavid Harding, M.D., M.S. Interventional Cardiologist   Pager # [phone] Phone # [phone] [address] [place], [place] [address]   Thank you for choosing [facility]!!

## 2018-06-18 MED FILL — CLOPIDOGREL 75 MG TABLET: 75 | 30 days supply | Qty: 30 | Fill #1

## 2018-06-18 MED FILL — ATORVASTATIN CALCIUM 40 MG: 40 | 30 days supply | Qty: 30 | Fill #1

## 2018-06-18 MED FILL — METOPROLOL TARTRATE 50 MG T: 50 | 30 days supply | Qty: 60 | Fill #1

## 2018-06-18 MED FILL — LISINOPRIL 5 MG TAB: 5 | 30 days supply | Qty: 30 | Fill #1

## 2018-07-18 MED FILL — ATORVASTATIN CALCIUM 40 MG: 40 | 30 days supply | Qty: 30 | Fill #2

## 2018-07-18 MED FILL — METOPROLOL TARTRATE 50 MG T: 50 | 30 days supply | Qty: 60 | Fill #2

## 2018-07-18 MED FILL — CLOPIDOGREL 75 MG TABLET: 75 | 30 days supply | Qty: 30 | Fill #2

## 2018-07-18 MED FILL — LISINOPRIL 5 MG TAB: 5 | 30 days supply | Qty: 30 | Fill #2

## 2018-09-17 MED FILL — ATORVASTATIN CALCIUM 40 MG: 40 | 30 days supply | Qty: 30 | Fill #4

## 2018-09-17 MED FILL — CLOPIDOGREL 75 MG TABLET: 75 | 30 days supply | Qty: 30 | Fill #4

## 2018-09-17 MED FILL — METOPROLOL TARTRATE 50 MG T: 50 | 30 days supply | Qty: 60 | Fill #4

## 2018-09-17 MED FILL — LISINOPRIL 5 MG TABLET: 5 | 30 days supply | Qty: 30 | Fill #4

## 2018-10-04 ENCOUNTER — Ambulatory Visit (INDEPENDENT_AMBULATORY_CARE_PROVIDER_SITE_OTHER): Payer: Self-pay | Admitting: Cardiology

## 2018-10-04 ENCOUNTER — Encounter: Payer: Self-pay | Admitting: Cardiology

## 2018-10-04 VITALS — BP 140/78 | HR 58 | Ht 67.0 in | Wt 127.8 lb

## 2018-10-04 DIAGNOSIS — I2510 Atherosclerotic heart disease of native coronary artery without angina pectoris: Secondary | ICD-10-CM

## 2018-10-04 DIAGNOSIS — E785 Hyperlipidemia, unspecified: Secondary | ICD-10-CM

## 2018-10-04 DIAGNOSIS — I10 Essential (primary) hypertension: Secondary | ICD-10-CM

## 2018-10-04 DIAGNOSIS — Z9861 Coronary angioplasty status: Secondary | ICD-10-CM

## 2018-10-04 MED ORDER — LISINOPRIL 10 MG PO TABS: 10.0000 mg | ORAL_TABLET | Freq: Every day | ORAL | 3 refills | Status: AC

## 2018-10-04 NOTE — Progress Notes (Signed)
Is  PCP: Loletta Specter, PA-C  Clinic Note: Chief Complaint  Patient presents with  . Follow-up    Overall doing well  . Coronary Artery Disease    No angina   HPI: Corey Allen is a 47 y.o. male with h/o Inf STEMI in April 2019 who presents today for 4-40-month follow-up after initial PA visit   Inferolateral STEMI 03/03/2018: Cath showed 100% occluded proximal RCA--> DES PCI 4.0 mm x 18 mm; also noted ostial D1 50%.  EF 45 to 50% with inferior hypokinesis. -->  Complicated by severe bradycardia with prolonged pauses requiring atropine.  Corey Allen was last seen on 05/14/2018 -was doing well.  Just very anxious and concerned about his health.  Feeling little bit tired but not any angina symptoms.  Recent Hospitalizations:   None since hospitalization for STEMI on April 14-16, 2019   Studies Personally Reviewed - (if available, images/films reviewed: From Epic Chart or Care Everywhere)  None  Interval History: Corey Allen returns here today still doing well.  He still is a little bit apprehensive but denies any anginal symptoms with rest or exertion.  He says he may occasionally get short of breath for no reason or may get dizzy off and on but nothing more he feels like he is going to pass out. No resting or exertional chest pain or dyspnea.  No PND, orthopnea or edema.  Maybe a few skipped beats but no rapid irregular heartbeats or palpitations.  No syncope or near syncope, TIA or amaurosis fugax.  No claudication.  No melena, hematochezia, hematuria or epistaxis.  He has questions about dietary concerns we reviewed his diet together.  He seems to be mostly eating rice and beans with either chicken, fish or steak small portions.  I explained to him that this is a fine acceptable diet.  Just to clear cheeses or sour cream etc.  I did tell him that he should feel fine enjoying a good meal with dessert at least once or twice a month.   ROS: A comprehensive was performed. Review of  Systems  Constitutional: Negative for malaise/fatigue (Energy is definitely getting better) and weight loss.  Respiratory: Negative for cough, shortness of breath and wheezing.   Musculoskeletal: Negative for falls and joint pain.  Neurological: Positive for dizziness (Off and on). Negative for tingling, weakness and headaches.  Psychiatric/Behavioral: The patient is nervous/anxious (Still has some anxiety about his heart attack).   All other systems reviewed and are negative.  I have reviewed and (if needed) personally updated the patient's problem list, medications, allergies, past medical and surgical history, social and family history.   Past Medical History:  Diagnosis Date  . Acute ST elevation myocardial infarction (STEMI) of inferolateral wall (HCC) 03/03/2018  . CAD S/P DES PCI -RCA (In setting of Inferolateral STEMI 03/03/2018   pRCA (Xience SIERRA 4.0 x 18 - 4.2 mm)  . Hyperlipidemia   . Hypertension     Past Surgical History:  Procedure Laterality Date  . CORONARY/GRAFT ACUTE MI REVASCULARIZATION N/A 03/03/2018   Procedure: Coronary/Graft Acute MI Revascularization;  Surgeon: Marykay Lex, MD;  Location: [facility] INVASIVE CV LAB;  Service: Cardiovascular: pRCA 100% thrombotic (DES PCI -XIENCE SIERRA 4.0 mm 18 mm-4.2 mm) -> prolonged pause - 1/2 Amp Atropoine  . LEFT HEART CATH AND CORONARY ANGIOGRAPHY N/A 03/03/2018   Procedure: LEFT HEART CATH AND CORONARY ANGIOGRAPHY;  Surgeon: Marykay Lex, MD;  Location: [facility] INVASIVE CV LAB;  Service: Cardiovascular: pRCA 100% thrombotic (DES  PCI), ostial D1 50%.  EF 45 to 50% with inferior HK.  Marland Kitchen TRANSTHORACIC ECHOCARDIOGRAM  03/04/2018   EF 45-50%.  Severe inferior-inferolateral HK.  Mild aortic calcification otherwise normal.    CARDIAC CATH-PCI 03/03/2018: pRCA 100% thrombotic (DES PCI -XIENCE SIERRA 4.0 mm 18 mm-4.2 mm), ostial D1 50%.  EF 45 to 50% with inferior HK.  2D ECHO 03/04/2018: EF 45-50%.  Severe inferior-inferolateral HK.   Mild aortic calcification otherwise normal.  Current Meds  Medication Sig  . aspirin 81 MG chewable tablet Chew 1 tablet (81 mg total) by mouth daily.  Marland Kitchen atorvastatin (LIPITOR) 40 MG tablet Take 1 tablet (40 mg total) by mouth daily.  . clopidogrel (PLAVIX) 75 MG tablet Take 1 tablet (75 mg total) by mouth daily.  . metoprolol tartrate (LOPRESSOR) 50 MG tablet Take 1 tablet (50 mg total) by mouth 2 (two) times daily.  . nitroGLYCERIN (NITROSTAT) 0.4 MG SL tablet Place 1 tablet (0.4 mg total) under the tongue every 5 (five) minutes as needed.  . [DISCONTINUED] lisinopril (PRINIVIL,ZESTRIL) 5 MG tablet Take 1 tablet (5 mg total) by mouth daily.    No Known Allergies  Social History   Tobacco Use  . Smoking status: Never Smoker  . Smokeless tobacco: Never Used  Substance Use Topics  . Alcohol use: Yes    Alcohol/week: 1.0 standard drinks    Types: 1 Cans of beer per week  . Drug use: Never   Social History   Social History Narrative  . Not on file    family history includes Hypertension in his father.  Wt Readings from Last 3 Encounters:  10/04/18 127 lb 12.8 oz (58 kg)  05/14/18 128 lb 3.2 oz (58.2 kg)  03/25/18 134 lb (60.8 kg)    PHYSICAL EXAM -- has not taken his BP medication yet today BP 140/78   Pulse (!) 58   Ht 5\' 7"  (1.702 m)   Wt 127 lb 12.8 oz (58 kg)   SpO2 99%   BMI 20.02 kg/m  Physical Exam  Constitutional: He is oriented to person, place, and time. He appears well-developed and well-nourished. No distress.  Healthy-appearing.  Well-groomed.   HENT:  Head: Normocephalic and atraumatic.  Mouth/Throat: Oropharynx is clear and moist.  Neck: Normal range of motion. Neck supple. No hepatojugular reflux and no JVD present. Carotid bruit is not present. No thyromegaly present.  Cardiovascular: Normal rate, regular rhythm, normal heart sounds, intact distal pulses and normal pulses.  No extrasystoles are present. PMI is not displaced. Exam reveals no gallop  and no friction rub.  No murmur heard. Pulmonary/Chest: Effort normal and breath sounds normal. No respiratory distress. He has no wheezes. He has no rales.  Abdominal: Soft. Bowel sounds are normal. He exhibits no distension. There is no tenderness. There is no rebound.  No HSM  Musculoskeletal: Normal range of motion. He exhibits no edema.  Neurological: He is alert and oriented to person, place, and time.  Psychiatric: He has a normal mood and affect. His behavior is normal. Judgment and thought content normal.  Vitals reviewed.    Adult ECG Report  not checked  Other studies Reviewed: Additional studies/ records that were reviewed today include:  Recent Labs:  ** excellent LIPIDS Lab Results  Component Value Date   CHOL 89 (L) 05/02/2018   HDL 31 (L) 05/02/2018   LDLCALC 41 05/02/2018   TRIG 83 05/02/2018   CHOLHDL 2.9 05/02/2018   Lab Results  Component Value Date  CREATININE 0.98 03/14/2018   BUN 20 03/14/2018   NA 135 03/14/2018   K 4.4 03/14/2018   CL 97 03/14/2018   CO2 22 03/14/2018    ASSESSMENT / PLAN: Problem List Items Addressed This Visit    CAD S/P DES PCI -RCA (In setting of Inferolateral STEMI - Primary (Chronic)    Status post large DES to RCA in setting of STEMI.  No further anginal symptoms. Convert from Brilinta to Plavix for cost concerns.  Now on aspirin plus Plavix.   He is on an ACE inhibitor and beta-blocker as well as statin. With no recurrent symptoms, continue current management.      Relevant Medications   lisinopril (PRINIVIL,ZESTRIL) 10 MG tablet   Other Relevant Orders   Lipid panel   Comprehensive metabolic panel   Essential hypertension (Chronic)    Borderline blood pressure today.  He does have a lot of stress.  Plan will be to titrate ACE inhibitor up to 10mg  daily on next refill.  Otherwise continue current dose of beta-blocker.      Relevant Medications   lisinopril (PRINIVIL,ZESTRIL) 10 MG tablet   Hyperlipidemia with  target LDL less than 70 (Chronic)    Excellent response to high-dose statin.  Reduce Lipitor to 40 mg last visit.  Will be due for lipid check prior to this follow-up appointment in April next year.  We talked about his diet as noted above.      Relevant Medications   lisinopril (PRINIVIL,ZESTRIL) 10 MG tablet   Other Relevant Orders   Lipid panel   Comprehensive metabolic panel      I spent a total of 25minutes with the patient and chart review. >  50% of the time was spent in direct patient consultation.   Current medicines are reviewed at length with the patient today.  (+/- concerns) -none The following changes have been made:  Will ensure prescriptions are in place.  Patient Instructions  Medication Instructions:   COMPLETE TAKING LISINOPRIL 5 MG DAILY WHEN BOTTLE IS EMTPY START TAKING 10 MG LISINOPRIL DAILY.   If you need a refill on your cardiac medications before your next appointment, please call your pharmacy.   Lab work: LABS IN April 2020 - WILL MAIL LABSLIP  BEFORE NEXT APPOINTMENT If you have labs (blood work) drawn today and your tests are completely normal, you will receive your results only by: Marland Kitchen. MyChart Message (if you have MyChart) OR . A paper copy in the mail If you have any lab test that is abnormal or we need to change your treatment, we will call you to review the results.  Testing/Procedures: NOT NEEDED  Follow-Up: At [facility], you and your health needs are our priority.  As part of our continuing mission to provide you with exceptional heart care, we have created designated Provider Care Teams.  These Care Teams include your primary Cardiologist (physician) and Advanced Practice Providers (APPs -  Physician Assistants and Nurse Practitioners) who all work together to provide you with the care you need, when you need it.   You will need a follow up appointment in 6 months( April or May 2020).  Please call our office 2 months in advance to schedule  this appointment.  You may see Bryan Lemmaavid Raydin Bielinski, MD or one of the following Advanced Practice Providers on your designated Care Team:   Theodore DemarkRhonda Barrett, PA-C . Joni ReiningKathryn Lawrence, DNP, ANP  Any Other Special Instructions Will Be Listed Below (If Applicable).  Studies Ordered:   Orders Placed This Encounter  Procedures  . Lipid panel  . Comprehensive metabolic panel      Bryan Lemma, M.D., M.S. Interventional Cardiologist   Pager # [phone] Phone # [phone] [address] [place], [place] [address]   Thank you for choosing Heartcare at [facility]!!

## 2018-10-04 NOTE — Assessment & Plan Note (Signed)
Excellent response to high-dose statin.  Reduce Lipitor to 40 mg last visit.  Will be due for lipid check prior to this follow-up appointment in April next year.  We talked about his diet as noted above.

## 2018-10-04 NOTE — Assessment & Plan Note (Signed)
Borderline blood pressure today.  He does have a lot of stress.  Plan will be to titrate ACE inhibitor up to 10mg  daily on next refill.  Otherwise continue current dose of beta-blocker.

## 2018-10-04 NOTE — Assessment & Plan Note (Signed)
Status post large DES to RCA in setting of STEMI.  No further anginal symptoms. Convert from Brilinta to Plavix for cost concerns.  Now on aspirin plus Plavix.   He is on an ACE inhibitor and beta-blocker as well as statin. With no recurrent symptoms, continue current management.

## 2018-10-04 NOTE — Patient Instructions (Signed)
Medication Instructions:   COMPLETE TAKING LISINOPRIL 5 MG DAILY WHEN BOTTLE IS EMTPY START TAKING 10 MG LISINOPRIL DAILY.   If you need a refill on your cardiac medications before your next appointment, please call your pharmacy.   Lab work: LABS IN April 2020 - WILL MAIL LABSLIP  BEFORE NEXT APPOINTMENT If you have labs (blood work) drawn today and your tests are completely normal, you will receive your results only by: Marland Kitchen. MyChart Message (if you have MyChart) OR . A paper copy in the mail If you have any lab test that is abnormal or we need to change your treatment, we will call you to review the results.  Testing/Procedures: NOT NEEDED  Follow-Up: At [facility], you and your health needs are our priority.  As part of our continuing mission to provide you with exceptional heart care, we have created designated Provider Care Teams.  These Care Teams include your primary Cardiologist (physician) and Advanced Practice Providers (APPs -  Physician Assistants and Nurse Practitioners) who all work together to provide you with the care you need, when you need it.   You will need a follow up appointment in 6 months( April or May 2020).  Please call our office 2 months in advance to schedule this appointment.  You may see Bryan Lemmaavid Harding, MD or one of the following Advanced Practice Providers on your designated Care Team:   Theodore DemarkRhonda Barrett, PA-C . Joni ReiningKathryn Lawrence, DNP, ANP  Any Other Special Instructions Will Be Listed Below (If Applicable).

## 2018-10-16 MED FILL — LISINOPRIL 10 MG TABS: 10 | 30 days supply | Qty: 30 | Fill #0

## 2018-10-16 MED FILL — ATORVASTATIN CALCIUM 40 MG: 40 | 30 days supply | Qty: 30 | Fill #5

## 2018-10-16 MED FILL — METOPROLOL TARTRATE 50 MG T: 50 | 30 days supply | Qty: 60 | Fill #5

## 2018-10-16 MED FILL — CLOPIDOGREL 75 MG TABLET: 75 | 30 days supply | Qty: 30 | Fill #5

## 2018-11-15 MED FILL — ATORVASTATIN CALCIUM 40 MG: 40 | 30 days supply | Qty: 30 | Fill #6

## 2018-11-15 MED FILL — CLOPIDOGREL 75 MG TABLET: 75 | 30 days supply | Qty: 30 | Fill #6

## 2018-11-15 MED FILL — METOPROLOL TARTRATE 50 MG T: 50 | 30 days supply | Qty: 60 | Fill #6

## 2018-11-15 MED FILL — LISINOPRIL 10 MG TABS: 10 | 30 days supply | Qty: 30 | Fill #1

## 2018-12-16 MED FILL — METOPROLOL TARTRATE 50 MG T: 50 | 30 days supply | Qty: 60 | Fill #7

## 2018-12-16 MED FILL — LISINOPRIL 10 MG TABS: 10 | 30 days supply | Qty: 30 | Fill #2

## 2018-12-16 MED FILL — CLOPIDOGREL 75 MG TABLET: 75 | 30 days supply | Qty: 30 | Fill #7

## 2018-12-16 MED FILL — ATORVASTATIN CALCIUM 40 MG: 40 | 30 days supply | Qty: 30 | Fill #7

## 2019-01-15 MED FILL — LISINOPRIL 10 MG TABS: 10 | 30 days supply | Qty: 30 | Fill #3

## 2019-01-15 MED FILL — CLOPIDOGREL 75 MG TABLET: 75 | 30 days supply | Qty: 30 | Fill #8

## 2019-01-15 MED FILL — METOPROLOL TARTRATE 50 MG T: 50 | 30 days supply | Qty: 60 | Fill #8

## 2019-01-15 MED FILL — ATORVASTATIN CALCIUM 40 MG: 40 | 30 days supply | Qty: 30 | Fill #8

## 2019-04-15 MED FILL — ?METOPROLOL 50 MG TABLET: 50 | 30 days supply | Qty: 60 | Fill #9

## 2019-04-15 MED FILL — CLOPIDOGREL 75 MG TABLET: 75 | 30 days supply | Qty: 30 | Fill #9

## 2019-04-15 MED FILL — ?ATORVASTATIN 40MG TABLET: 40 | 30 days supply | Qty: 30 | Fill #9

## 2019-04-15 MED FILL — LISINOPRIL 10 MG TABS: 10 | 30 days supply | Qty: 30 | Fill #4

## 2019-05-13 MED FILL — ?METOPROLOL 50 MG TABLET: 50 | 30 days supply | Qty: 60 | Fill #10

## 2019-05-13 MED FILL — LISINOPRIL 10 MG TABS: 10 | 30 days supply | Qty: 30 | Fill #5

## 2019-05-13 MED FILL — ?ATORVASTATIN 40MG TABLET: 40 | 30 days supply | Qty: 30 | Fill #10

## 2019-05-13 MED FILL — CLOPIDOGREL 75 MG TABLET: 75 | 30 days supply | Qty: 30 | Fill #10

## 2019-06-13 ENCOUNTER — Other Ambulatory Visit: Payer: Self-pay | Admitting: Cardiology

## 2019-06-13 DIAGNOSIS — I2510 Atherosclerotic heart disease of native coronary artery without angina pectoris: Secondary | ICD-10-CM

## 2019-06-13 MED FILL — ?METOPROLOL 50 MG TABLET: 50 | 30 days supply | Qty: 60 | Fill #0

## 2019-06-13 MED FILL — LISINOPRIL 10 MG TABS: 10 | 30 days supply | Qty: 30 | Fill #6

## 2019-06-13 MED FILL — ?ATORVASTATIN 40MG TABLET: 40 | 10 days supply | Qty: 30 | Fill #0

## 2019-06-13 MED FILL — CLOPIDOGREL 75 MG TABLET: 75 | 30 days supply | Qty: 30 | Fill #0

## 2019-07-01 IMAGING — DX DG CHEST 1V PORT
1 series · 1 of 1 positions shown · non-contrast
Comparison: None.

CLINICAL DATA: Chest pain starting at midnight.

EXAM:
PORTABLE CHEST 1 VIEW

[chest ap]
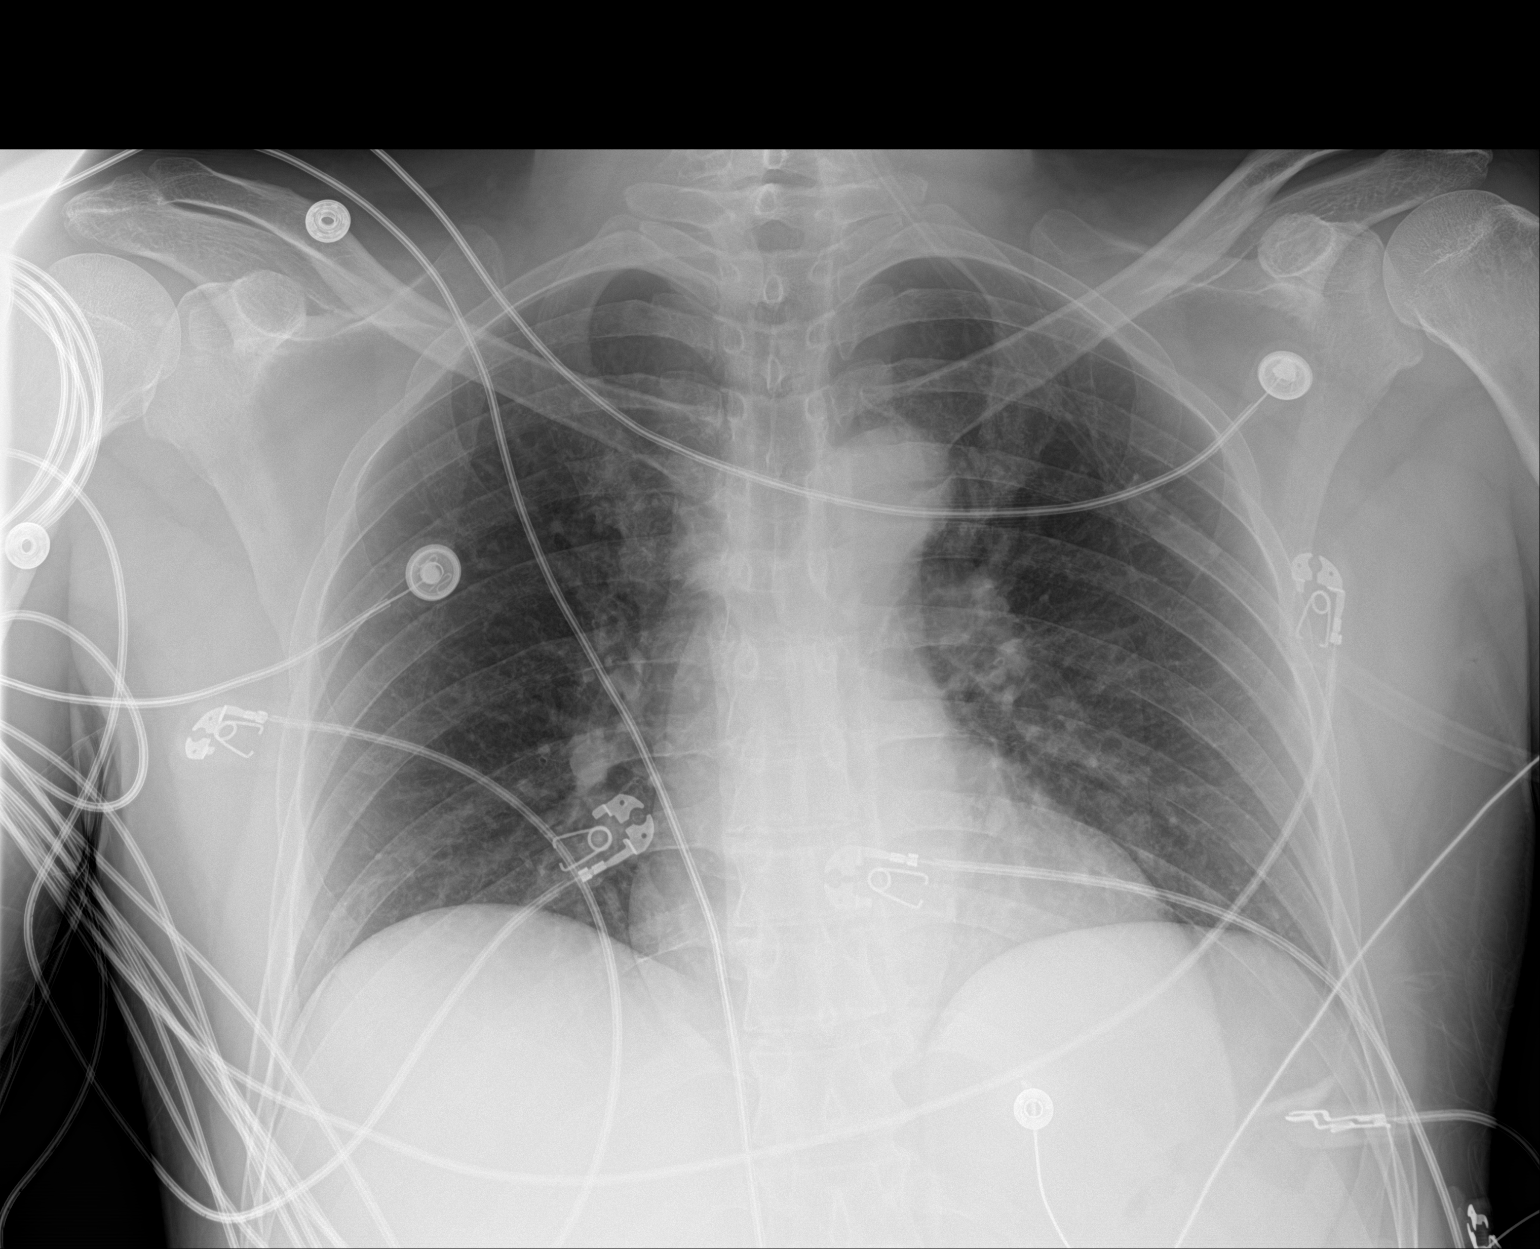

[1 of 1 positions shown; findings below may reference images not displayed]

FINDINGS: The heart size and mediastinal contours are within normal limits.
Mild uncoiling of the thoracic aorta without aneurysm. No pulmonary
consolidation or CHF. No effusion or pneumothorax. The visualized
skeletal structures are unremarkable.
IMPRESSION: No active disease.
